# Patient Record
Sex: Male | Born: 1960 | Race: White | Hispanic: No | Marital: Married | Smoking: Never smoker
Health system: Southern US, Community
[De-identification: ages and names within clinical notes are randomized; demographics above are authoritative.]

## PROBLEM LIST (undated history)

## (undated) DIAGNOSIS — K219 Gastro-esophageal reflux disease without esophagitis: Secondary | ICD-10-CM

## (undated) DIAGNOSIS — I10 Essential (primary) hypertension: Secondary | ICD-10-CM

## (undated) DIAGNOSIS — M1990 Unspecified osteoarthritis, unspecified site: Secondary | ICD-10-CM

## (undated) DIAGNOSIS — J449 Chronic obstructive pulmonary disease, unspecified: Secondary | ICD-10-CM

## (undated) DIAGNOSIS — F988 Other specified behavioral and emotional disorders with onset usually occurring in childhood and adolescence: Secondary | ICD-10-CM

## (undated) DIAGNOSIS — M549 Dorsalgia, unspecified: Secondary | ICD-10-CM

## (undated) DIAGNOSIS — E119 Type 2 diabetes mellitus without complications: Secondary | ICD-10-CM

## (undated) DIAGNOSIS — F32A Depression, unspecified: Secondary | ICD-10-CM

## (undated) DIAGNOSIS — E785 Hyperlipidemia, unspecified: Secondary | ICD-10-CM

## (undated) HISTORY — PX: BACK SURGERY: SHX140

## (undated) HISTORY — PX: HERNIA REPAIR: SHX51

## (undated) HISTORY — PX: SPINE SURGERY: SHX786

## (undated) HISTORY — PX: FRACTURE SURGERY: SHX138

---

## 2013-12-10 ENCOUNTER — Observation Stay: Payer: Self-pay | Admitting: Surgery

## 2013-12-10 ENCOUNTER — Emergency Department: Payer: BC Managed Care – PPO

## 2013-12-10 ENCOUNTER — Observation Stay
Admission: EM | Admit: 2013-12-10 | Discharge: 2013-12-13 | Disposition: A | Discharge status: Home / Self Care | Payer: BC Managed Care – PPO | Attending: Surgery | Admitting: Surgery

## 2013-12-10 DIAGNOSIS — Z794 Long term (current) use of insulin: Secondary | ICD-10-CM | POA: Insufficient documentation

## 2013-12-10 DIAGNOSIS — S060X1A Concussion with loss of consciousness of 30 minutes or less, initial encounter: Secondary | ICD-10-CM | POA: Insufficient documentation

## 2013-12-10 DIAGNOSIS — S27321A Contusion of lung, unilateral, initial encounter: Principal | ICD-10-CM | POA: Insufficient documentation

## 2013-12-10 DIAGNOSIS — M25511 Pain in right shoulder: Secondary | ICD-10-CM | POA: Insufficient documentation

## 2013-12-10 DIAGNOSIS — F329 Major depressive disorder, single episode, unspecified: Secondary | ICD-10-CM | POA: Insufficient documentation

## 2013-12-10 DIAGNOSIS — E1165 Type 2 diabetes mellitus with hyperglycemia: Secondary | ICD-10-CM | POA: Insufficient documentation

## 2013-12-10 DIAGNOSIS — M160 Bilateral primary osteoarthritis of hip: Secondary | ICD-10-CM | POA: Insufficient documentation

## 2013-12-10 DIAGNOSIS — J449 Chronic obstructive pulmonary disease, unspecified: Secondary | ICD-10-CM | POA: Insufficient documentation

## 2013-12-10 DIAGNOSIS — J9611 Chronic respiratory failure with hypoxia: Secondary | ICD-10-CM | POA: Insufficient documentation

## 2013-12-10 DIAGNOSIS — Z9981 Dependence on supplemental oxygen: Secondary | ICD-10-CM | POA: Insufficient documentation

## 2013-12-10 DIAGNOSIS — M542 Cervicalgia: Secondary | ICD-10-CM | POA: Insufficient documentation

## 2013-12-10 DIAGNOSIS — K219 Gastro-esophageal reflux disease without esophagitis: Secondary | ICD-10-CM | POA: Insufficient documentation

## 2013-12-10 DIAGNOSIS — Y92410 Unspecified street and highway as the place of occurrence of the external cause: Secondary | ICD-10-CM | POA: Insufficient documentation

## 2013-12-10 DIAGNOSIS — Y999 Unspecified external cause status: Secondary | ICD-10-CM | POA: Insufficient documentation

## 2013-12-10 DIAGNOSIS — F988 Other specified behavioral and emotional disorders with onset usually occurring in childhood and adolescence: Secondary | ICD-10-CM | POA: Insufficient documentation

## 2013-12-10 DIAGNOSIS — E785 Hyperlipidemia, unspecified: Secondary | ICD-10-CM | POA: Insufficient documentation

## 2013-12-10 DIAGNOSIS — R55 Syncope and collapse: Secondary | ICD-10-CM

## 2013-12-10 DIAGNOSIS — S060X9A Concussion with loss of consciousness of unspecified duration, initial encounter: Secondary | ICD-10-CM

## 2013-12-10 DIAGNOSIS — M546 Pain in thoracic spine: Secondary | ICD-10-CM | POA: Insufficient documentation

## 2013-12-10 DIAGNOSIS — G894 Chronic pain syndrome: Secondary | ICD-10-CM | POA: Insufficient documentation

## 2013-12-10 DIAGNOSIS — E119 Type 2 diabetes mellitus without complications: Secondary | ICD-10-CM

## 2013-12-10 DIAGNOSIS — M4322 Fusion of spine, cervical region: Secondary | ICD-10-CM | POA: Insufficient documentation

## 2013-12-10 DIAGNOSIS — Z79899 Other long term (current) drug therapy: Secondary | ICD-10-CM | POA: Insufficient documentation

## 2013-12-10 DIAGNOSIS — I10 Essential (primary) hypertension: Secondary | ICD-10-CM | POA: Insufficient documentation

## 2013-12-10 HISTORY — DX: Type 2 diabetes mellitus without complications: E11.9

## 2013-12-10 HISTORY — DX: Essential (primary) hypertension: I10

## 2013-12-10 HISTORY — DX: Other specified behavioral and emotional disorders with onset usually occurring in childhood and adolescence: F98.8

## 2013-12-10 HISTORY — DX: Depression, unspecified: F32.A

## 2013-12-10 HISTORY — DX: Chronic obstructive pulmonary disease, unspecified: J44.9

## 2013-12-10 HISTORY — DX: Gastro-esophageal reflux disease without esophagitis: K21.9

## 2013-12-10 HISTORY — DX: Dorsalgia, unspecified: M54.9

## 2013-12-10 HISTORY — DX: Hyperlipidemia, unspecified: E78.5

## 2013-12-10 HISTORY — DX: Unspecified osteoarthritis, unspecified site: M19.90

## 2013-12-10 LAB — I-STAT CHEM 8 CARTRIDGE
Anion Gap I-Stat: 19 — ABNORMAL HIGH (ref 7–16)
BUN I-Stat: 28 mg/dL — ABNORMAL HIGH (ref 6–20)
Calcium Ionized I-Stat: 4.7 mg/dL (ref 4.35–5.10)
Chloride I-Stat: 98 mMol/L (ref 98–112)
Creatinine I-Stat: 1.1 mg/dL (ref 0.90–1.30)
EGFR: >60 mL/min/{1.73_m2}
Glucose I-Stat: 372 mg/dL — ABNORMAL HIGH (ref 70–99)
Hematocrit I-Stat: 40 % (ref 39.0–52.5)
Hemoglobin I-Stat: 13.6 gm/dL (ref 13.0–17.5)
Potassium I-Stat: 4.6 mMol/L (ref 3.5–5.3)
Sodium I-Stat: 134 mMol/L — ABNORMAL LOW (ref 135–145)
TCO2 I-Stat: 23 mMol/L — ABNORMAL LOW (ref 24–29)

## 2013-12-10 LAB — CBC AND DIFFERENTIAL
Basophils %: 0.7 % (ref 0.0–3.0)
Basophils Absolute: 0 10*3/uL (ref 0.0–0.3)
Eosinophils %: 0.6 % (ref 0.0–7.0)
Eosinophils Absolute: 0 10*3/uL (ref 0.0–0.8)
Hematocrit: 39.5 % (ref 39.0–52.5)
Hemoglobin: 14 gm/dL (ref 13.0–17.5)
Lymphocytes Absolute: 1.4 10*3/uL (ref 0.6–5.1)
Lymphocytes: 26.8 % (ref 15.0–46.0)
MCH: 32 pg (ref 28–35)
MCHC: 35 gm/dL (ref 32–36)
MCV: 90 fL (ref 80–100)
MPV: 8.8 fL (ref 6.0–10.0)
Monocytes Absolute: 0.4 10*3/uL (ref 0.1–1.7)
Monocytes: 7.5 % (ref 3.0–15.0)
Neutrophils %: 64.4 % (ref 42.0–78.0)
Neutrophils Absolute: 3.3 10*3/uL (ref 1.7–8.6)
PLT CT: 142 10*3/uL (ref 130–440)
RBC: 4.39 10*6/uL (ref 4.00–5.70)
RDW: 11.4 % (ref 11.0–14.0)
WBC: 5.1 10*3/uL (ref 4.0–11.0)

## 2013-12-10 LAB — VH URINE DRUG SCREEN
Amphetamine: NEGATIVE
Barbiturates: NEGATIVE
Buprenorphine, Urine: NEGATIVE
Cannabinoids: NEGATIVE
Cocaine: NEGATIVE
Opiates: NEGATIVE
Phencyclidine: NEGATIVE
Urine Benzodiazepines: NEGATIVE
Urine Creatinine Random: 32.4 mg/dL
Urine Methadone Screen: NEGATIVE
Urine Oxycodone: NEGATIVE
Urine Specific Gravity: 1.002 (ref 1.001–1.040)
pH, Urine: 5.4 pH (ref 5.0–8.0)

## 2013-12-10 LAB — ECG 12-LEAD
P Wave Axis: 13 deg
P Wave Duration: 110 ms
P-R Interval: 158 ms
Patient Age: 53 years
Q-T Dispersion: 52 ms
Q-T Interval(Corrected): 421 ms
Q-T Interval: 344 ms
QRS Axis: 8 deg
QRS Duration: 88 ms
T Axis: -3 deg
Ventricular Rate: 90 /min

## 2013-12-10 LAB — VH I-STAT CHEM 8 NOTIFICATION

## 2013-12-10 LAB — VH DEXTROSE STICK GLUCOSE
Glucose POCT: 263 mg/dL — ABNORMAL HIGH (ref 70–99)
Glucose POCT: 220 mg/dL — ABNORMAL HIGH (ref 70–99)

## 2013-12-10 LAB — ETHANOL: Alcohol: <10 mg/dL (ref 0–9)

## 2013-12-10 LAB — I-STAT TROPONIN: Troponin I I-Stat: <0.02 ng/mL (ref 0.00–0.02)

## 2013-12-10 LAB — VH I-STAT TROPONIN NOTIFICATION

## 2013-12-10 MED ORDER — SODIUM CHLORIDE 0.9 % IJ SOLN
0.4000 mg | INTRAMUSCULAR | Status: DC | PRN
Start: 2013-12-10 — End: 2013-12-13

## 2013-12-10 MED ORDER — ENOXAPARIN SODIUM 30 MG/0.3ML SC SOLN
30.0000 mg | Freq: Two times a day (BID) | SUBCUTANEOUS | Status: DC
Start: 2013-12-10 — End: 2013-12-13
  Administered 2013-12-10 – 2013-12-13 (×6): 30 mg via SUBCUTANEOUS
  Filled 2013-12-10 (×8): qty 0.3

## 2013-12-10 MED ORDER — SODIUM CHLORIDE 0.9 % IV SOLN
INTRAVENOUS | Status: AC
Start: 2013-12-10 — End: 2013-12-10

## 2013-12-10 MED ORDER — INSULIN ASPART 100 UNIT/ML SC SOPN
2.0000 [IU] | PEN_INJECTOR | Freq: Three times a day (TID) | SUBCUTANEOUS | Status: DC | PRN
Start: 2013-12-10 — End: 2013-12-13
  Administered 2013-12-11: 7 [IU] via SUBCUTANEOUS
  Administered 2013-12-11: 4 [IU] via SUBCUTANEOUS
  Administered 2013-12-11: 7 [IU] via SUBCUTANEOUS
  Administered 2013-12-12: 4 [IU] via SUBCUTANEOUS
  Administered 2013-12-13: 7 [IU] via SUBCUTANEOUS
  Administered 2013-12-13: 2 [IU] via SUBCUTANEOUS

## 2013-12-10 MED ORDER — IOHEXOL 350 MG/ML IV SOLN
100.0000 mL | Freq: Once | INTRAVENOUS | Status: AC | PRN
Start: 2013-12-10 — End: 2013-12-10
  Administered 2013-12-10: 100 mL via INTRAVENOUS

## 2013-12-10 MED ORDER — BACLOFEN 10 MG PO TABS
10.0000 mg | ORAL_TABLET | Freq: Two times a day (BID) | ORAL | Status: DC
Start: 2013-12-10 — End: 2013-12-13
  Administered 2013-12-10 – 2013-12-13 (×7): 10 mg via ORAL
  Filled 2013-12-10 (×8): qty 1

## 2013-12-10 MED ORDER — BISACODYL 10 MG RE SUPP
10.0000 mg | Freq: Every day | RECTAL | Status: DC | PRN
Start: 2013-12-10 — End: 2013-12-13

## 2013-12-10 MED ORDER — ONDANSETRON HCL 4 MG/2ML IJ SOLN
4.0000 mg | Freq: Once | INTRAMUSCULAR | Status: AC
Start: 2013-12-10 — End: 2013-12-10
  Administered 2013-12-10: 4 mg via INTRAVENOUS

## 2013-12-10 MED ORDER — LISINOPRIL 20 MG PO TABS
20.0000 mg | ORAL_TABLET | Freq: Every day | ORAL | Status: DC
Start: 2013-12-10 — End: 2013-12-13
  Administered 2013-12-10 – 2013-12-13 (×4): 20 mg via ORAL
  Filled 2013-12-10 (×4): qty 1

## 2013-12-10 MED ORDER — BUPROPION HCL ER (SR) 150 MG PO TB12
150.0000 mg | ORAL_TABLET | Freq: Two times a day (BID) | ORAL | Status: DC
Start: 2013-12-10 — End: 2013-12-13
  Administered 2013-12-10 – 2013-12-13 (×7): 150 mg via ORAL
  Filled 2013-12-10 (×8): qty 1

## 2013-12-10 MED ORDER — OXYCODONE-ACETAMINOPHEN 5-325 MG PO TABS
1.0000 | ORAL_TABLET | ORAL | Status: DC | PRN
Start: 2013-12-10 — End: 2013-12-11
  Administered 2013-12-10 – 2013-12-11 (×5): 1 via ORAL
  Filled 2013-12-10 (×4): qty 1

## 2013-12-10 MED ORDER — METHYLPHENIDATE HCL 10 MG PO TABS
20.0000 mg | ORAL_TABLET | Freq: Three times a day (TID) | ORAL | Status: DC
Start: 2013-12-10 — End: 2013-12-13
  Administered 2013-12-10 – 2013-12-13 (×10): 20 mg via ORAL
  Filled 2013-12-10 (×10): qty 2

## 2013-12-10 MED ORDER — GLUCAGON 1 MG IJ SOLR (WRAP)
1.0000 mg | INTRAMUSCULAR | Status: DC | PRN
Start: 2013-12-10 — End: 2013-12-13

## 2013-12-10 MED ORDER — ONDANSETRON HCL 4 MG/2ML IJ SOLN
4.0000 mg | Freq: Four times a day (QID) | INTRAMUSCULAR | Status: DC | PRN
Start: 2013-12-10 — End: 2013-12-13

## 2013-12-10 MED ORDER — VANCOMYCIN HCL IN DEXTROSE 1-5 GM/200ML-% IV SOLN
1000.0000 mg | Freq: Once | INTRAVENOUS | Status: DC
Start: 2013-12-10 — End: 2013-12-10

## 2013-12-10 MED ORDER — INSULIN ASPART 100 UNIT/ML SC SOPN
12.0000 [IU] | PEN_INJECTOR | Freq: Three times a day (TID) | SUBCUTANEOUS | Status: DC
Start: 2013-12-10 — End: 2013-12-13
  Administered 2013-12-10 – 2013-12-13 (×9): 12 [IU] via SUBCUTANEOUS
  Filled 2013-12-10: qty 3

## 2013-12-10 MED ORDER — SODIUM CHLORIDE 0.9 % IV BOLUS
3000.0000 mL | Freq: Once | INTRAVENOUS | Status: DC
Start: 2013-12-10 — End: 2013-12-10

## 2013-12-10 MED ORDER — VH HYDROMORPHONE HCL PF 1 MG/ML CARPUJECT
1.0000 mg | Freq: Once | INTRAMUSCULAR | Status: AC
Start: 2013-12-10 — End: 2013-12-10
  Administered 2013-12-10: 1 mg via INTRAVENOUS

## 2013-12-10 MED ORDER — DOCUSATE SODIUM 100 MG PO CAPS
100.0000 mg | ORAL_CAPSULE | Freq: Two times a day (BID) | ORAL | Status: DC
Start: 2013-12-10 — End: 2013-12-13
  Administered 2013-12-10 – 2013-12-13 (×6): 100 mg via ORAL
  Filled 2013-12-10 (×7): qty 1

## 2013-12-10 MED ORDER — FAMOTIDINE 20 MG PO TABS
20.0000 mg | ORAL_TABLET | Freq: Two times a day (BID) | ORAL | Status: DC
Start: 2013-12-10 — End: 2013-12-13
  Administered 2013-12-10 – 2013-12-13 (×7): 20 mg via ORAL
  Filled 2013-12-10 (×9): qty 1

## 2013-12-10 MED ORDER — ACETAMINOPHEN 325 MG PO TABS
650.0000 mg | ORAL_TABLET | ORAL | Status: DC | PRN
Start: 2013-12-10 — End: 2013-12-13

## 2013-12-10 MED ORDER — OXYCODONE-ACETAMINOPHEN 5-325 MG PO TABS
ORAL_TABLET | ORAL | Status: AC
Start: 2013-12-10 — End: ?
  Filled 2013-12-10: qty 1

## 2013-12-10 MED ORDER — VH HYDROMORPHONE HCL 1 MG/ML (NARRATOR)
INTRAMUSCULAR | Status: AC
Start: 2013-12-10 — End: ?
  Filled 2013-12-10: qty 1

## 2013-12-10 MED ORDER — ESCITALOPRAM OXALATE 10 MG PO TABS
30.0000 mg | ORAL_TABLET | Freq: Every day | ORAL | Status: DC
Start: 2013-12-10 — End: 2013-12-13
  Administered 2013-12-10 – 2013-12-13 (×4): 30 mg via ORAL
  Filled 2013-12-10 (×4): qty 3

## 2013-12-10 MED ORDER — CLONIDINE HCL 0.2 MG PO TABS
0.3000 mg | ORAL_TABLET | Freq: Every evening | ORAL | Status: DC
Start: 2013-12-10 — End: 2013-12-13
  Administered 2013-12-10 – 2013-12-13 (×4): 0.3 mg via ORAL
  Filled 2013-12-10 (×4): qty 1

## 2013-12-10 MED ORDER — CELECOXIB 100 MG PO CAPS
100.0000 mg | ORAL_CAPSULE | Freq: Two times a day (BID) | ORAL | Status: DC
Start: 2013-12-10 — End: 2013-12-13
  Administered 2013-12-10 – 2013-12-13 (×7): 100 mg via ORAL
  Filled 2013-12-10 (×8): qty 1

## 2013-12-10 MED ORDER — SODIUM CHLORIDE 0.9 % IV MBP
500.0000 mg | Freq: Four times a day (QID) | INTRAVENOUS | Status: DC
Start: 2013-12-10 — End: 2013-12-10

## 2013-12-10 MED ORDER — ONDANSETRON HCL 4 MG/2ML IJ SOLN
INTRAMUSCULAR | Status: AC
Start: 2013-12-10 — End: ?
  Filled 2013-12-10: qty 2

## 2013-12-10 MED ORDER — SODIUM CHLORIDE 0.9 % IV BOLUS
500.0000 mL | Freq: Once | INTRAVENOUS | Status: AC
Start: 2013-12-10 — End: 2013-12-10
  Administered 2013-12-10: 500 mL via INTRAVENOUS

## 2013-12-10 MED ORDER — DEXTROSE 50 % IV SOLN
25.0000 mL | INTRAVENOUS | Status: DC | PRN
Start: 2013-12-10 — End: 2013-12-13

## 2013-12-10 MED ORDER — PRAVASTATIN SODIUM 40 MG PO TABS
80.0000 mg | ORAL_TABLET | Freq: Every evening | ORAL | Status: DC
Start: 2013-12-10 — End: 2013-12-13
  Administered 2013-12-10 – 2013-12-12 (×3): 80 mg via ORAL
  Filled 2013-12-10 (×4): qty 2

## 2013-12-10 MED ORDER — INSULIN GLARGINE 100 UNIT/ML SC SOPN
20.0000 [IU] | PEN_INJECTOR | Freq: Every evening | SUBCUTANEOUS | Status: DC
Start: 2013-12-10 — End: 2013-12-13
  Administered 2013-12-10 – 2013-12-12 (×3): 20 [IU] via SUBCUTANEOUS
  Filled 2013-12-10: qty 3

## 2013-12-10 NOTE — ED Notes (Signed)
[facility] for med list

## 2013-12-10 NOTE — ED Notes (Signed)
Xray to room

## 2013-12-10 NOTE — Plan of Care (Signed)
Problem: Pain interferes with ability to perform ADL  Goal: Pain at adequate level as identified by patient  Outcome: Progressing
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
## 2013-12-10 NOTE — Plan of Care (Signed)
Problem: Safety  Goal: Patient will be free from injury during hospitalization  Outcome: Progressing    Problem: Pain  Goal: Patient's pain/discomfort is manageable  Outcome: Progressing
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
## 2013-12-10 NOTE — Progress Notes (Signed)
A/Ox3, rating pain 7/10.  Pt denies any other complaints and would like pain med after he eats.  Neuro check WNL.  VSS, assessment complete, call bell w/in reach, will continue monitoring.

## 2013-12-10 NOTE — ED Notes (Signed)
Patient resting in bed. C/o of soreness in neck and shoulder, rates pain a 6. Medicated for pain with 1 percocet.   Transport ordered. Call with any questions [phone]
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
## 2013-12-11 ENCOUNTER — Observation Stay: Payer: BC Managed Care – PPO

## 2013-12-11 DIAGNOSIS — J9611 Chronic respiratory failure with hypoxia: Secondary | ICD-10-CM | POA: Diagnosis present

## 2013-12-11 LAB — CBC AND DIFFERENTIAL
Basophils %: 0.9 % (ref 0.0–3.0)
Basophils Absolute: 0 10*3/uL (ref 0.0–0.3)
Eosinophils %: 1.5 % (ref 0.0–7.0)
Eosinophils Absolute: 0.1 10*3/uL (ref 0.0–0.8)
Hematocrit: 37.3 % — ABNORMAL LOW (ref 39.0–52.5)
Hemoglobin: 12.9 gm/dL — ABNORMAL LOW (ref 13.0–17.5)
Lymphocytes Absolute: 2 10*3/uL (ref 0.6–5.1)
Lymphocytes: 42.3 % (ref 15.0–46.0)
MCH: 32 pg (ref 28–35)
MCHC: 35 gm/dL (ref 32–36)
MCV: 92 fL (ref 80–100)
MPV: 9.2 fL (ref 6.0–10.0)
Monocytes Absolute: 0.3 10*3/uL (ref 0.1–1.7)
Monocytes: 7 % (ref 3.0–15.0)
Neutrophils %: 48.2 % (ref 42.0–78.0)
Neutrophils Absolute: 2.3 10*3/uL (ref 1.7–8.6)
PLT CT: 128 10*3/uL — ABNORMAL LOW (ref 130–440)
RBC: 4.07 10*6/uL (ref 4.00–5.70)
RDW: 11.7 % (ref 11.0–14.0)
WBC: 4.7 10*3/uL (ref 4.0–11.0)

## 2013-12-11 LAB — COMPREHENSIVE METABOLIC PANEL
ALT: 41 U/L (ref 0–55)
AST (SGOT): 25 U/L (ref 10–42)
Albumin/Globulin Ratio: 1.09 Ratio (ref 0.70–1.50)
Albumin: 3.5 gm/dL (ref 3.5–5.0)
Alkaline Phosphatase: 66 U/L (ref 40–145)
Anion Gap: 13.8 mMol/L (ref 7.0–18.0)
BUN / Creatinine Ratio: 22.9 Ratio (ref 10.0–30.0)
BUN: 25 mg/dL — ABNORMAL HIGH (ref 7–22)
Bilirubin, Total: 0.4 mg/dL (ref 0.1–1.2)
CO2: 23.4 mMol/L (ref 20.0–30.0)
Calcium: 9 mg/dL (ref 8.5–10.5)
Chloride: 103 mMol/L (ref 98–110)
Creatinine: 1.09 mg/dL (ref 0.80–1.30)
EGFR: >60 mL/min/{1.73_m2}
Globulin: 3.2 gm/dL (ref 2.0–4.0)
Glucose: 226 mg/dL — ABNORMAL HIGH (ref 70–99)
Osmolality Calc: 283 mOsm/kg (ref 275–300)
Potassium: 4.2 mMol/L (ref 3.5–5.3)
Protein, Total: 6.7 gm/dL (ref 6.0–8.3)
Sodium: 136 mMol/L (ref 136–147)

## 2013-12-11 LAB — VH DEXTROSE STICK GLUCOSE
Glucose POCT: 254 mg/dL — ABNORMAL HIGH (ref 70–99)
Glucose POCT: 235 mg/dL — ABNORMAL HIGH (ref 70–99)
Glucose POCT: 257 mg/dL — ABNORMAL HIGH (ref 70–99)
Glucose POCT: 168 mg/dL — ABNORMAL HIGH (ref 70–99)

## 2013-12-11 LAB — PHOSPHORUS: Phosphorus: 4 mg/dL (ref 2.3–4.7)

## 2013-12-11 LAB — TROPONIN I
Troponin I: <0.01 ng/mL (ref 0.00–0.02)
Troponin I: 0.01 ng/mL (ref 0.00–0.02)

## 2013-12-11 LAB — MAGNESIUM: Magnesium: 2 mg/dL (ref 1.6–2.6)

## 2013-12-11 MED ORDER — OXYCODONE-ACETAMINOPHEN 5-325 MG PO TABS
1.0000 | ORAL_TABLET | ORAL | Status: DC | PRN
Start: 2013-12-11 — End: 2013-12-13
  Administered 2013-12-11 – 2013-12-13 (×5): 2 via ORAL
  Administered 2013-12-13: 1 via ORAL
  Filled 2013-12-11 (×3): qty 2
  Filled 2013-12-11: qty 1
  Filled 2013-12-11 (×2): qty 2

## 2013-12-11 MED ORDER — DIPHENHYDRAMINE HCL 25 MG PO CAPS
25.0000 mg | ORAL_CAPSULE | Freq: Four times a day (QID) | ORAL | Status: DC | PRN
Start: 2013-12-11 — End: 2013-12-13
  Administered 2013-12-11: 25 mg via ORAL
  Filled 2013-12-11: qty 1

## 2013-12-11 NOTE — Progress Notes (Signed)
Pt alert and oreinted 4x. Reports pain is 5-6/10. Pain medication given per order. Will continue to monitor.

## 2013-12-11 NOTE — Progress Notes (Signed)
A/Ox3, rating pain 6/10.   Pt denies any other complaints.  VSS, assessment complete, call bell w/in reach, will continue monitoring.
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
## 2013-12-11 NOTE — Plan of Care (Signed)
Problem: Safety  Goal: Patient will be free from injury during hospitalization  Outcome: Progressing    Problem: Pain  Goal: Patient's pain/discomfort is manageable  Outcome: Progressing    Problem: Pain interferes with ability to perform ADL  Goal: Pain at adequate level as identified by patient  Outcome: Progressing
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
## 2013-12-11 NOTE — Plan of Care (Signed)
Problem: Safety  Goal: Patient will be free from injury during hospitalization  Outcome: Progressing
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
## 2013-12-12 ENCOUNTER — Observation Stay: Payer: BC Managed Care – PPO

## 2013-12-12 LAB — VH DEXTROSE STICK GLUCOSE
Glucose POCT: 133 mg/dL — ABNORMAL HIGH (ref 70–99)
Glucose POCT: 224 mg/dL — ABNORMAL HIGH (ref 70–99)
Glucose POCT: 80 mg/dL (ref 70–99)
Glucose POCT: 225 mg/dL — ABNORMAL HIGH (ref 70–99)

## 2013-12-12 LAB — URINALYSIS
Bilirubin, UA: NEGATIVE
Blood, UA: NEGATIVE
Glucose, UA: NEGATIVE mg/dL
Ketones UA: NEGATIVE mg/dL
Leukocyte Esterase, UA: NEGATIVE Leu/uL
Nitrite, UA: NEGATIVE
Protein, UR: NEGATIVE mg/dL
RBC, UA: 1 /hpf (ref 0–4)
Urine Specific Gravity: 1.005 (ref 1.001–1.040)
Urobilinogen, UA: NORMAL mg/dL
WBC, UA: <1 /hpf (ref 0–4)
pH, Urine: 5 pH (ref 5.0–8.0)

## 2013-12-12 LAB — BASIC METABOLIC PANEL
Anion Gap: 11.9 mMol/L (ref 7.0–18.0)
BUN / Creatinine Ratio: 18.6 Ratio (ref 10.0–30.0)
BUN: 19 mg/dL (ref 7–22)
CO2: 24.2 mMol/L (ref 20.0–30.0)
Calcium: 9.4 mg/dL (ref 8.5–10.5)
Chloride: 106 mMol/L (ref 98–110)
Creatinine: 1.02 mg/dL (ref 0.80–1.30)
EGFR: >60 mL/min/{1.73_m2}
Glucose: 141 mg/dL — ABNORMAL HIGH (ref 70–99)
Osmolality Calc: 280 mOsm/kg (ref 275–300)
Potassium: 4.1 mMol/L (ref 3.5–5.3)
Sodium: 138 mMol/L (ref 136–147)

## 2013-12-12 MED ORDER — BACITRACIN 50000 UNITS IM SOLR
INTRAMUSCULAR | Status: AC
Start: 2013-12-12 — End: ?
  Filled 2013-12-12: qty 100000

## 2013-12-12 MED ORDER — VH PERFLUTREN LIPID MICROSPHERE 6.52 MG/ML IV SUSP
INTRAVENOUS | Status: AC
Start: 2013-12-12 — End: ?
  Filled 2013-12-12: qty 2

## 2013-12-12 MED ORDER — VH PERFLUTREN LIPID MICROSPHERE 6.52 MG/ML IV SUSP
Freq: Once | INTRAVENOUS | Status: AC
Start: 2013-12-12 — End: 2013-12-12
  Administered 2013-12-12: 2 mL via INTRAVENOUS
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
## 2013-12-12 NOTE — Progress Notes (Signed)
RN shift assessment completed.  Pt AOx4, c/o pain 7/10 but is unable to receive pain meds at this time.  Denies any n/v, tolerating consistent carb diet with blood glucose level of 133 this AM; BS active with reported flatus. Call bell within reach.  Will continue to monitor.
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
## 2013-12-13 DIAGNOSIS — S060X1A Concussion with loss of consciousness of 30 minutes or less, initial encounter: Secondary | ICD-10-CM

## 2013-12-13 LAB — VH DEXTROSE STICK GLUCOSE
Glucose POCT: 182 mg/dL — ABNORMAL HIGH (ref 70–99)
Glucose POCT: 139 mg/dL — ABNORMAL HIGH (ref 70–99)
Glucose POCT: 256 mg/dL — ABNORMAL HIGH (ref 70–99)

## 2013-12-13 MED ORDER — OXYCODONE-ACETAMINOPHEN 5-325 MG PO TABS
1.0000 | ORAL_TABLET | ORAL | Status: DC | PRN
Start: 2013-12-13 — End: 2017-04-30
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
## 2013-12-13 NOTE — Plan of Care (Signed)
Problem: Pain  Goal: Patient's pain/discomfort is manageable  Outcome: Adequate for Discharge    Comments:   Good pain control on PO analgesic.
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
## 2013-12-13 NOTE — Progress Notes (Signed)
Discharged to home. Instructions given.

## 2014-07-14 DEATH — deceased

## 2019-04-12 IMAGING — MR MULTI PARAMETRIC MRI PROSTATE WITHOUT AND WITH CONTRAST
13 series · 48 of 48 positions shown · IV contrast (agent unspecified)
Comparison: None at this facility.

MULTI PARAMETRIC MRI PROSTATE WITHOUT AND WITH CONTRAST, 04/12/2019 [DATE]: 
CLINICAL INDICATION: History of prostatic malignancy diagnosed 5556, untreated. 
Recent PSA is 7.7.
TECHNIQUE: Multiple parametric sequences were performed. 
Pre-contrast: T1 axial of the entire pelvis. T2 sagittal, axial and coronal, T1 
axial, acquired of the prostate. Diffusion with multiple B values of 1999, 5200 
calculated ADC value for mapping. 
Post contrast: Rapid sequence dynamic and axial planes through the prostate and 
seminal vesicles, T1 axial with fat sat of the entire pelvis. 3-D renderings 
were reconstructed on an independent workstation. The images were also evaluated 
with Dyna CAD computer aided detection.  12.5 cc Gadovist injected intravenously 
at 3 cc/s.  As per [HOSPITAL] guidelines 3-D reconstructions 
are performed with concurrent physician supervision. The patient's eGFR was 
calculated to be 61.8 using the i-STAT device.

[Series 101: survey · axial · 10.0mm · 1.39mm/px · 1 of 11 slices shown]
[im 1/11]
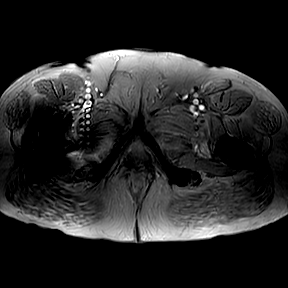

[Series 201: T1 · axial · 5.0mm · 0.75mm/px · 1 of 44 slices shown (1 of 2)]
[im 1/44]
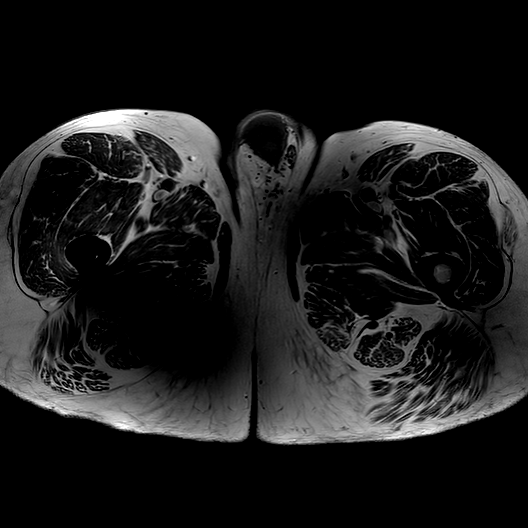

[Series 301: t2w sag · sagittal · 3.0mm · 0.45mm/px · 1 of 30 slices shown]
[im 1/30]
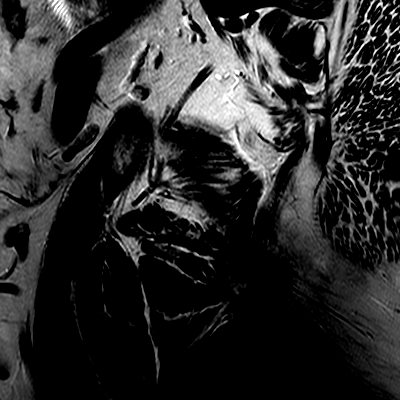

[Series 401: t2w cor · coronal · 3.0mm · 0.42mm/px · 1 of 30 slices shown]
[im 1/30]
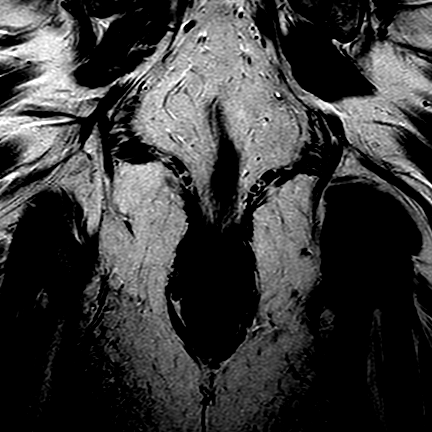

[Series 501: t2w ax · axial · 3.0mm · 0.27mm/px · 1 of 32 slices shown]
[im 1/32]
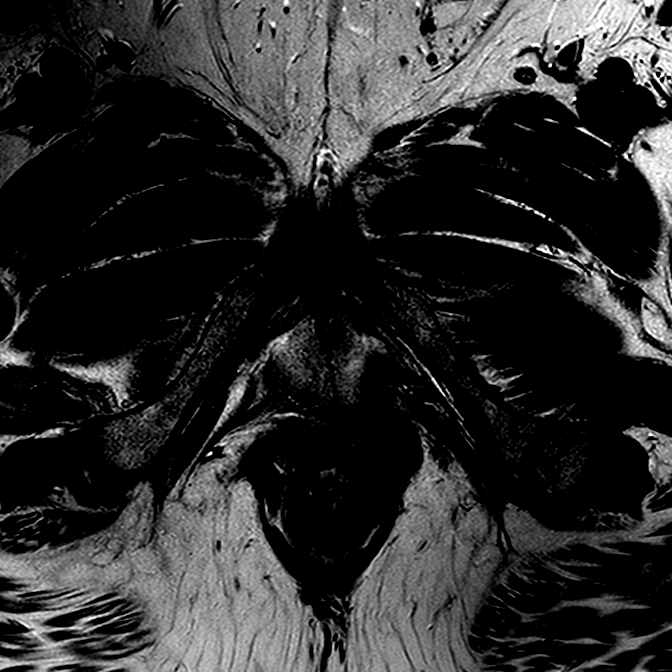

[Series 601: new-dwi_3b* 3mm* · axial · 3.0mm · 1.28mm/px · 1 of 64 slices shown]
[im 1/64]
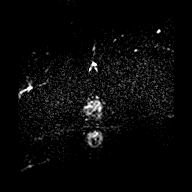

[Series 602: ADC · axial · 3.0mm · 1.28mm/px · 1 of 32 slices shown (1 of 2)]
[im 1/32]
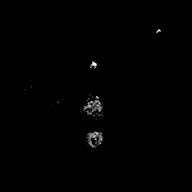

[Series 603: ADC · axial · 3.0mm · 1.28mm/px · 1 of 32 slices shown (2 of 2)]
[im 1/32]
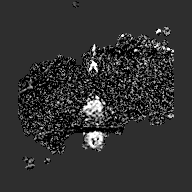

[Series 604: (id) · axial · 3.0mm · 1.28mm/px · 1 of 32 slices shown (1 of 3)]
[im 1/32]
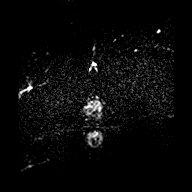

[Series 605: (id) · axial · 3.0mm · 1.28mm/px · 1 of 32 slices shown (2 of 3)]
[im 1/32]
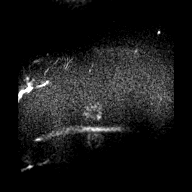

[Series 701: (id) · axial · 3.0mm · 1.28mm/px · 1 of 32 slices shown (3 of 3)]
[im 1/32]
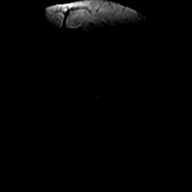

[Series 901: dyn 3mm*(ap) · axial · 3.0mm · 1.38mm/px · z∈[-13,+80]mm · 36 of 1440 slices shown]
[im 1/1440]
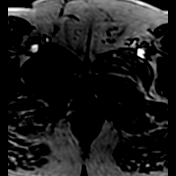
[im 42/1440]
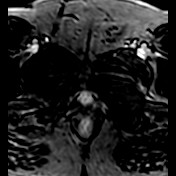
[im 83/1440]
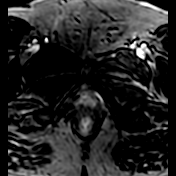
[im 124/1440]
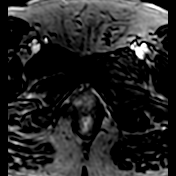
[im 165/1440]
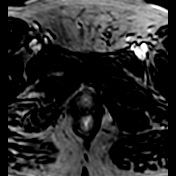
[im 206/1440]
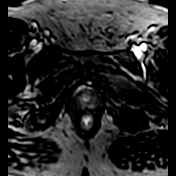
[im 247/1440]
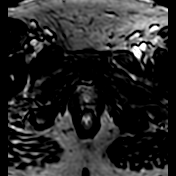
[im 288/1440]
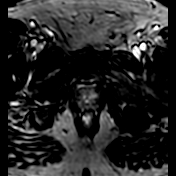
[im 329/1440]
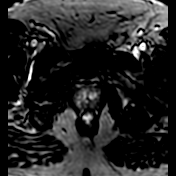
[im 371/1440]
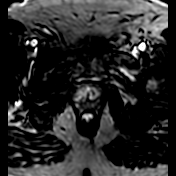
[im 412/1440]
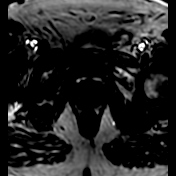
[im 453/1440]
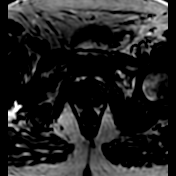
[im 494/1440]
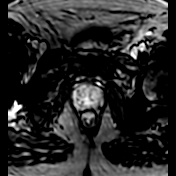
[im 535/1440]
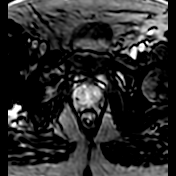
[im 576/1440]
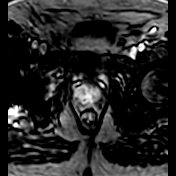
[im 617/1440]
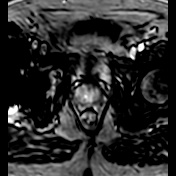
[im 658/1440]
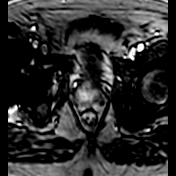
[im 699/1440]
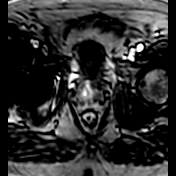
[im 741/1440]
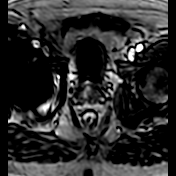
[im 782/1440]
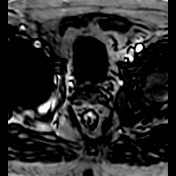
[im 823/1440]
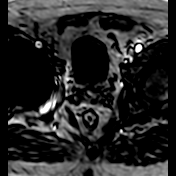
[im 864/1440]
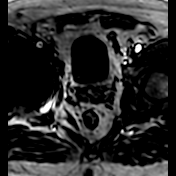
[im 905/1440]
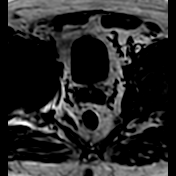
[im 946/1440]
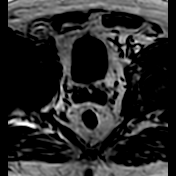
[im 987/1440]
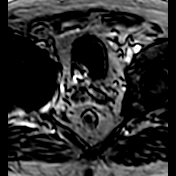
[im 1028/1440]
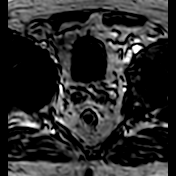
[im 1069/1440]
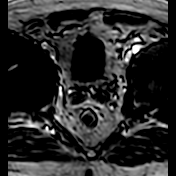
[im 1111/1440]
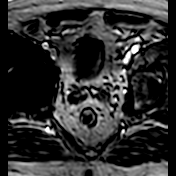
[im 1152/1440]
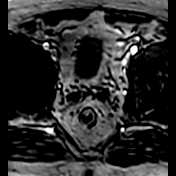
[im 1193/1440]
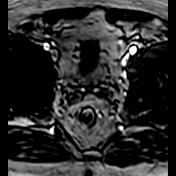
[im 1234/1440]
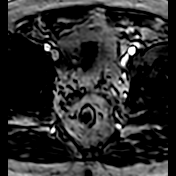
[im 1275/1440]
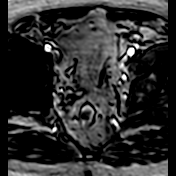
[im 1316/1440]
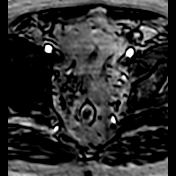
[im 1357/1440]
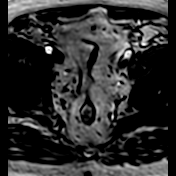
[im 1398/1440]
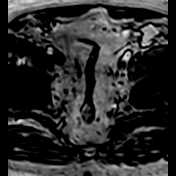
[im 1440/1440]
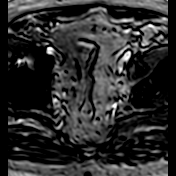

[Series 1001: T1 · axial · 5.0mm · 0.75mm/px · 1 of 44 slices shown (2 of 2)]
[im 1/44]
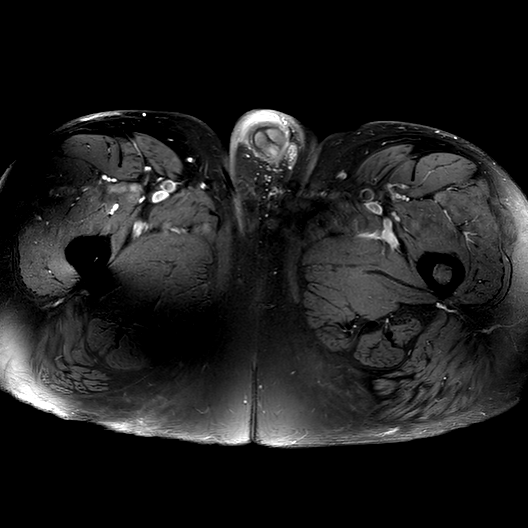

[48 of 48 positions shown; findings below may reference images not displayed]

FINDINGS: VOLUME: Dimensions are 4.3 x 3.8 x 4.3 cm with a volume of 34 cc's. 
CENTRAL GLAND: There is minimal Central gland hypertrophy. No suspicious central 
gland lesions are found. 
PERIPHERAL ZONE: There are no suspicious peripheral zone lesions on T2 images or 
on diffusion sequences. 
PROSTATE CAPSULE: Normal. 
MUSCLE SIDE WALLS: Normal. 
SEMINAL VESICLES: Normal. 
BLADDER: Unremarkable. 
LYMPHADENOPATHY: No pathologically enlarged pelvic lymph nodes are detected. 
BONES: No suspicious skeletal lesions are found in the pelvic girdle. 
OTHER: Left hamstring tendinopathy is noted. There is artifact in the RIGHT hip 
due to orthopedic hardware. Severe degenerative changes are noted in the lower 
lumbar spine. Surgical changes from inguinal herniorrhaphy is noted.
IMPRESSION: (PI-RADS 2): Low (clinically significant cancer is unlikely to be present).

## 2019-09-07 IMAGING — MR MULTI PARAMETRIC MRI PROSTATE WITHOUT AND WITH CONTRAST
13 series · 48 of 48 positions shown · IV contrast (gadavist)
Comparison: Previous prostate MRI dated 04/12/2019

MULTI PARAMETRIC MRI PROSTATE WITHOUT AND WITH CONTRAST, 09/07/2019 [DATE]: 
CLINICAL INDICATION: History of low-grade prostate neoplasm patient undergoing 
surveillance with increasing PSA level. 
PSA level: 11.6 ng/mL
TECHNIQUE: Multiple parametric sequences were performed. 
Pre-contrast: T1 axial of the entire pelvis. T2 sagittal, axial and coronal, T1 
axial, acquired of the prostate. Diffusion with multiple B values of 4111, 1688 
calculated ADC value for mapping. 
Post contrast: Rapid sequence dynamic and axial planes through the prostate and 
seminal vesicles, T1 axial with fat sat of the entire pelvis. 3-D renderings 
were reconstructed on an independent workstation. The images were also evaluated 
with Dyna CAD computer aided detection.  13 mL of Gadavist was administered 
intravenously.  As per [HOSPITAL] guidelines 3-D 
reconstructions are performed with concurrent physician supervision.

[Series 201: survey · axial · 10.0mm · 1.39mm/px · 1 of 11 slices shown]
[im 1/11]
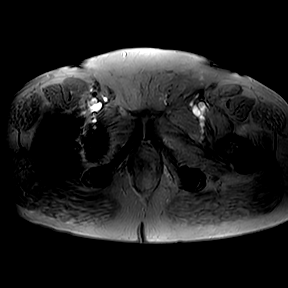

[Series 301: T1 · axial · 6.0mm · 0.57mm/px · 1 of 36 slices shown]
[im 1/36]
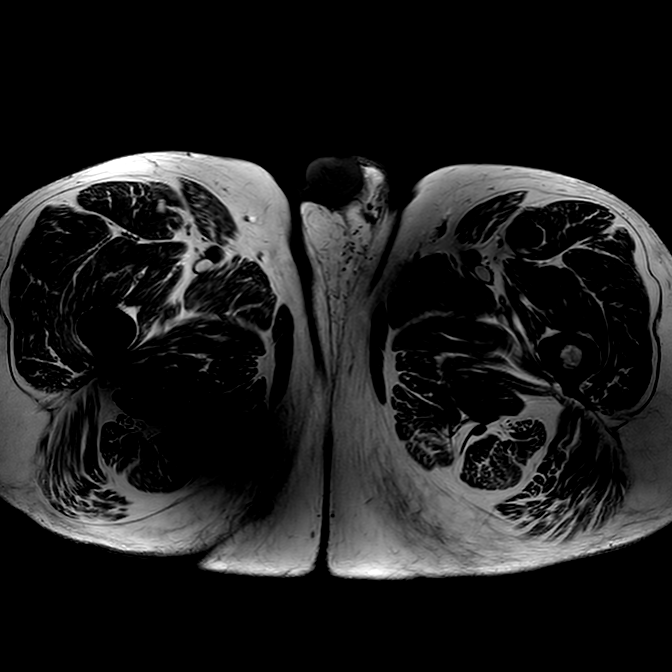

[Series 401: t2w sag · sagittal · 3.0mm · 0.45mm/px · 1 of 30 slices shown]
[im 1/30]
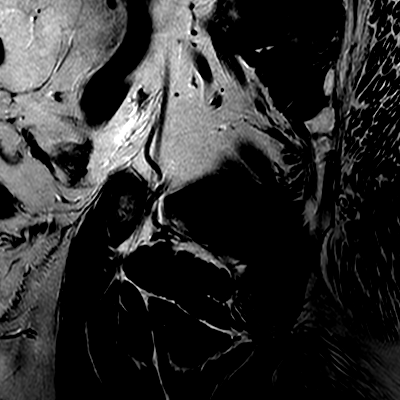

[Series 501: t2w cor · coronal · 3.0mm · 0.42mm/px · 1 of 30 slices shown]
[im 1/30]
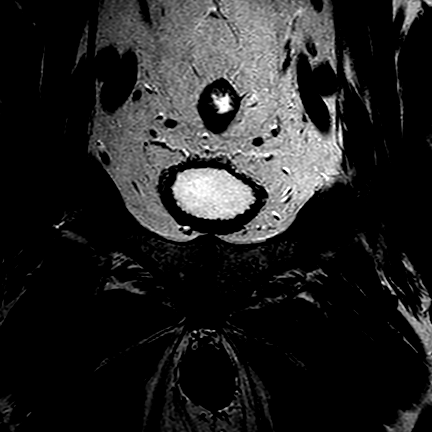

[Series 601: t2w ax · axial · 3.0mm · 0.27mm/px · 1 of 32 slices shown]
[im 1/32]
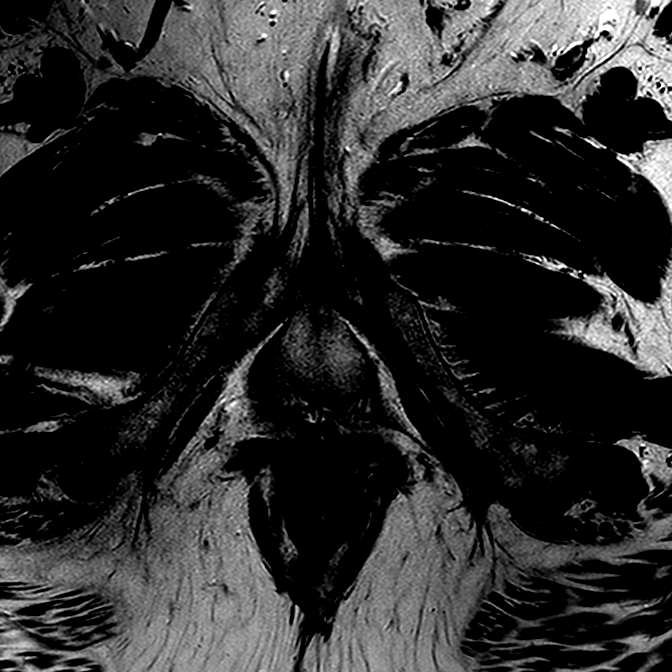

[Series 701: new-dwi_3b* 3mm* · axial · 3.0mm · 1.28mm/px · 1 of 64 slices shown]
[im 1/64]
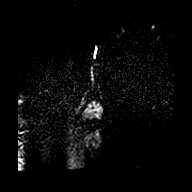

[Series 702: ADC · axial · 3.0mm · 1.28mm/px · 1 of 32 slices shown (1 of 2)]
[im 1/32]
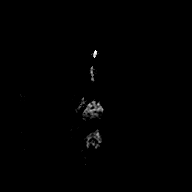

[Series 703: ADC · axial · 3.0mm · 1.28mm/px · 1 of 32 slices shown (2 of 2)]
[im 1/32]
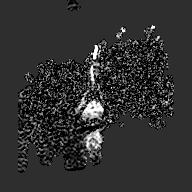

[Series 704: (id) · axial · 3.0mm · 1.28mm/px · 1 of 32 slices shown (1 of 3)]
[im 1/32]
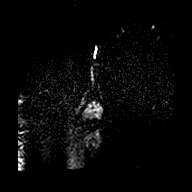

[Series 705: (id) · axial · 3.0mm · 1.28mm/px · 1 of 32 slices shown (2 of 3)]
[im 1/32]
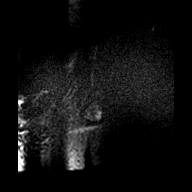

[Series 801: (id) · axial · 3.0mm · 1.28mm/px · 1 of 32 slices shown (3 of 3)]
[im 1/32]
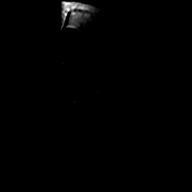

[Series 1001: dyn 3mm*(ap) · axial · 3.0mm · 1.38mm/px · z∈[-8,+85]mm · 36 of 1440 slices shown]
[im 1/1440]
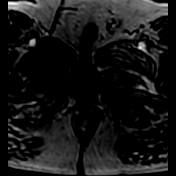
[im 42/1440]
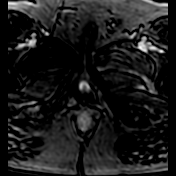
[im 83/1440]
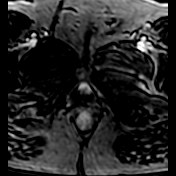
[im 124/1440]
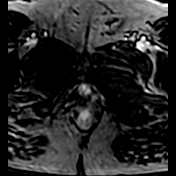
[im 165/1440]
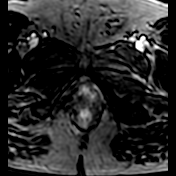
[im 206/1440]
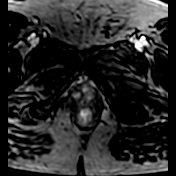
[im 247/1440]
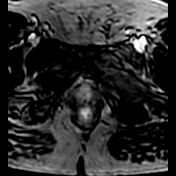
[im 288/1440]
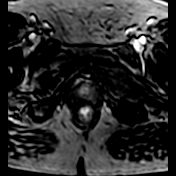
[im 329/1440]
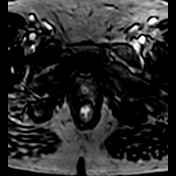
[im 371/1440]
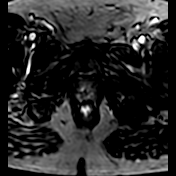
[im 412/1440]
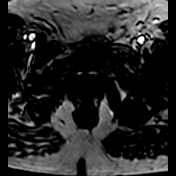
[im 453/1440]
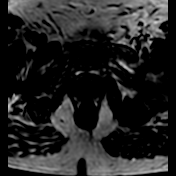
[im 494/1440]
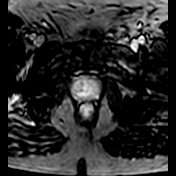
[im 535/1440]
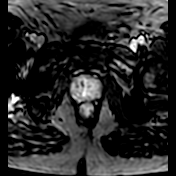
[im 576/1440]
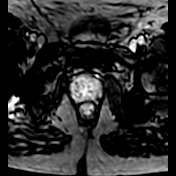
[im 617/1440]
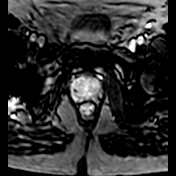
[im 658/1440]
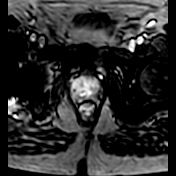
[im 699/1440]
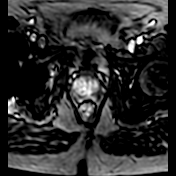
[im 741/1440]
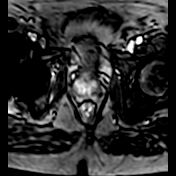
[im 782/1440]
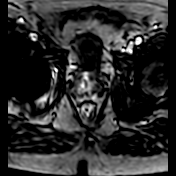
[im 823/1440]
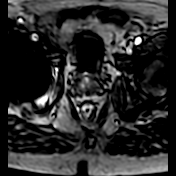
[im 864/1440]
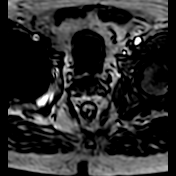
[im 905/1440]
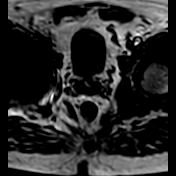
[im 946/1440]
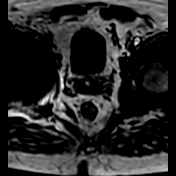
[im 987/1440]
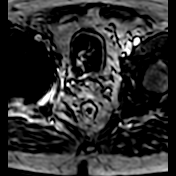
[im 1028/1440]
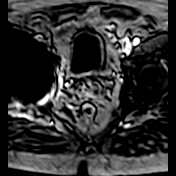
[im 1069/1440]
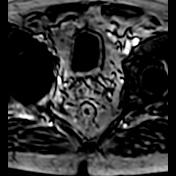
[im 1111/1440]
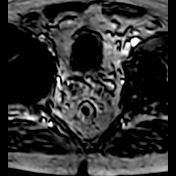
[im 1152/1440]
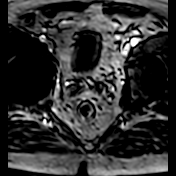
[im 1193/1440]
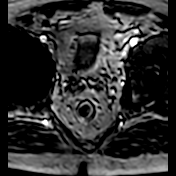
[im 1234/1440]
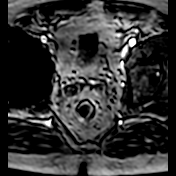
[im 1275/1440]
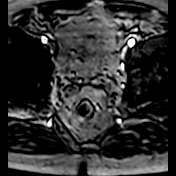
[im 1316/1440]
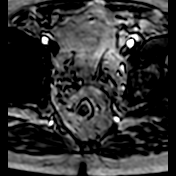
[im 1357/1440]
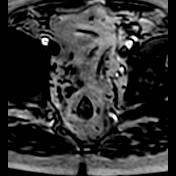
[im 1398/1440]
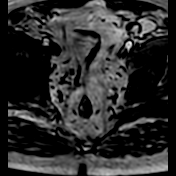
[im 1440/1440]
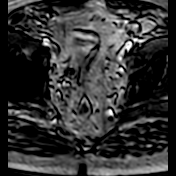

[Series 1101: (person_name)1/(person_name) · axial · 6.0mm · 0.68mm/px · 1 of 36 slices shown]
[im 1/36]
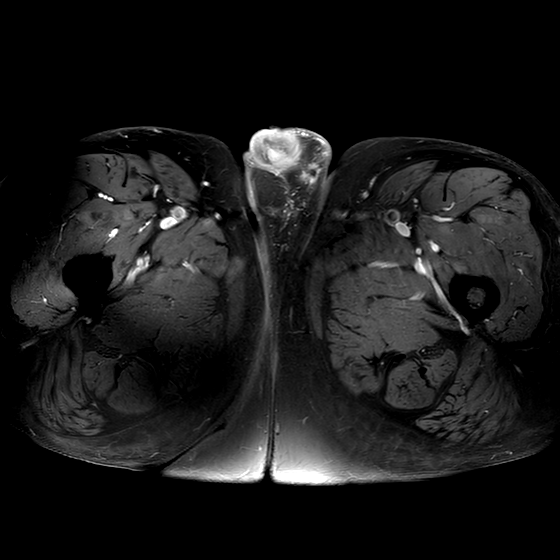

[48 of 48 positions shown; findings below may reference images not displayed]

FINDINGS: VOLUME: 32.8 cc 
CENTRAL GLAND: Nodular and mildly enlarged compatible with BPH. No suspicious 
central gland lesions. 
PERIPHERAL ZONE: The peripheral zone as relatively thin and shows heterogeneous 
T2 signal. There is a small area of decreased T2 signal within the right 
peripheral zone at the level of the apex at approximately 7 8 o'clock in 
location. This measures approximately 1.2 x 0.5 x 0.9 cm and appears to be more 
prominent compared to previous prostate MRI. This shows very subtle ADC and 
diffusion changes as well as increased vascularity on the perfusion images. 
While this may reflect an area of prostatitis underlying neoplasm would be 
difficult to exclude. The remainder the peripheral zone is unremarkable. 
PROSTATE CAPSULE: Intact 
MUSCLE SIDE WALLS: Normal 
SEMINAL VESICLES: Normal 
BLADDER: Normal 
LYMPHADENOPATHY: None 
BONES: No suspicious lesions. Previous right hip replacement.
IMPRESSION: 1. There is a small area of decreased T2 signal involving the right peripheral 
zone at the apex of the gland at approximately 7-8 o'clock position. This is 
more prominent compared to the prior examination and could potentially reflect 
focal prostatitis however underlying neoplasm would be difficult to exclude. 
This was targeted with the DynaCAD software. Consider biopsy if there is 
persistent elevation of the PSA level. 
2. BPH. No suspicious central gland lesions. 
3. Heterogeneous T2 signal of the peripheral zone likely related to chronic 
scarring. 
4. No adenopathy or bony lesions. 
(PI-RADS 4): High (clinically significant cancer is likely to be present).

## 2020-09-24 IMAGING — MR MULTI PARAMETRIC MRI PROSTATE W/O AND W
13 series · 48 of 48 positions shown · IV contrast (gadavist)
Comparison: Prior exam of 09/07/2019 and 04/12/2019.

________________________________________________________________________________________________ 
MULTI PARAMETRIC MRI PROSTATE W/O AND W, 09/24/2020 [DATE]: 
CLINICAL INDICATION: PSA increasing over 12. History of prostate carcinoma on 
prior MRI in August 2019. Active surveillance.
TECHNIQUE: Multiple parametric sequences were performed. Patient was scanned on 
a 3T magnet.  
Pre-contrast: T1 axial of the entire pelvis. T2 sagittal, axial and coronal, T1 
axial, acquired of the prostate. Diffusion with multiple B values of 6555, 4855 
calculated ADC value for mapping. 
Post contrast: Rapid sequence dynamic and axial planes through the prostate and 
seminal vesicles, T1 axial with fat sat of the entire pelvis. 3-D renderings 
were reconstructed on an independent workstation. The images were also evaluated 
with Dyna CAD computer aided detection. 13 of Gadavist were injected 
intravenously with the injector at a rate of 1.5 mL per second. As per [HOSPITAL] guidelines 3-D reconstructions are performed with 
concurrent physician supervision.

[Series 101: survey-flex coils · axial · 10.0mm · 1.34mm/px · 1 of 14 slices shown]
[im 1/14]
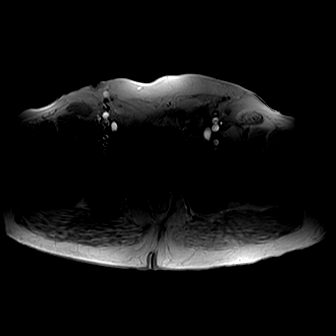

[Series 201: T1 · axial · 6.0mm · 0.57mm/px · 1 of 36 slices shown]
[im 1/36]
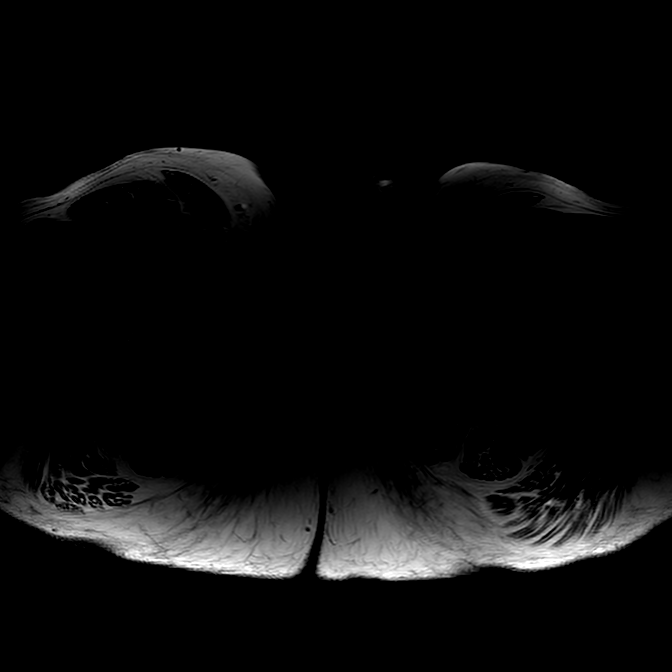

[Series 301: t2w sag · sagittal · 3.0mm · 0.45mm/px · 1 of 30 slices shown]
[im 1/30]
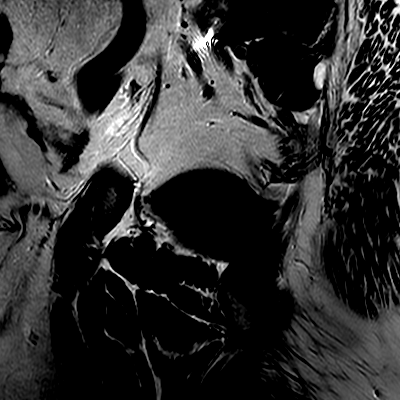

[Series 401: t2w cor · coronal · 3.0mm · 0.42mm/px · 1 of 30 slices shown]
[im 1/30]
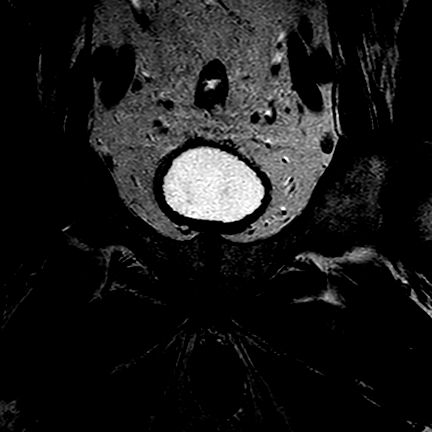

[Series 501: t2w ax · axial · 3.0mm · 0.27mm/px · 1 of 32 slices shown]
[im 1/32]
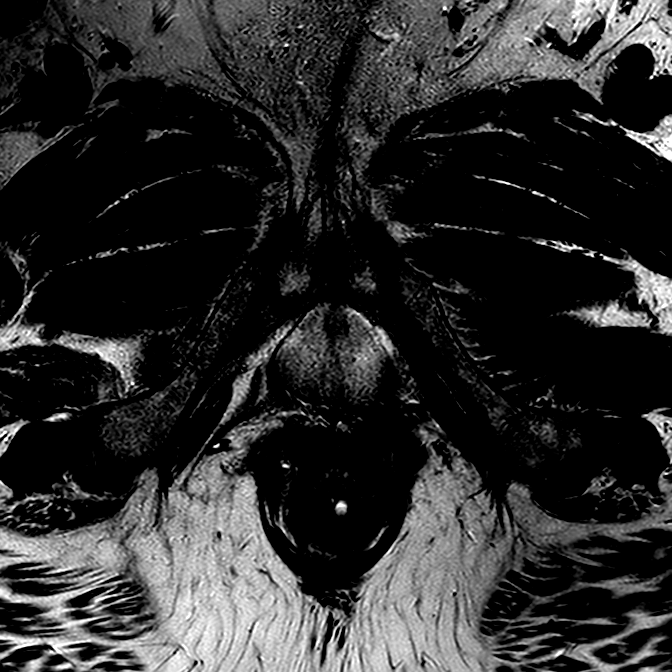

[Series 601: new-dwi_3b* 3mm* · axial · 3.0mm · 1.28mm/px · 1 of 60 slices shown]
[im 1/60]
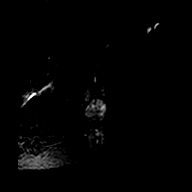

[Series 602: ADC · axial · 3.0mm · 1.28mm/px · 1 of 32 slices shown (1 of 2)]
[im 1/32]
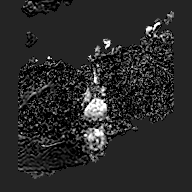

[Series 603: ADC · axial · 3.0mm · 1.28mm/px · 1 of 32 slices shown (2 of 2)]
[im 1/32]
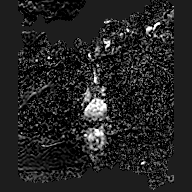

[Series 604: (id) · axial · 3.0mm · 1.28mm/px · 1 of 31 slices shown (1 of 3)]
[im 1/31]
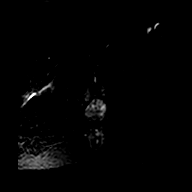

[Series 605: (id) · axial · 3.0mm · 1.28mm/px · 1 of 28 slices shown (2 of 3)]
[im 1/28]
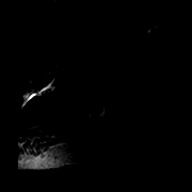

[Series 701: (id) · axial · 3.0mm · 1.28mm/px · 1 of 32 slices shown (3 of 3)]
[im 1/32]
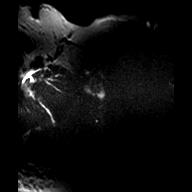

[Series 901: dyn 3mm*(ap) · axial · 3.0mm · 1.38mm/px · z∈[-48,+45]mm · 36 of 1440 slices shown]
[im 1/1440]
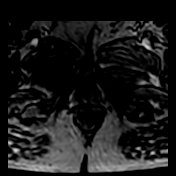
[im 42/1440]
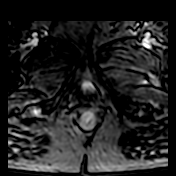
[im 83/1440]
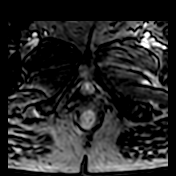
[im 124/1440]
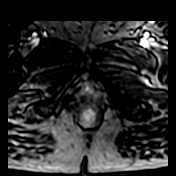
[im 165/1440]
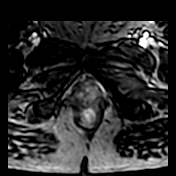
[im 206/1440]
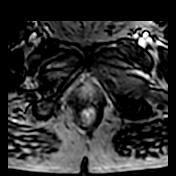
[im 247/1440]
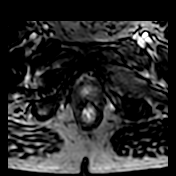
[im 288/1440]
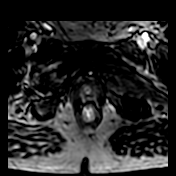
[im 329/1440]
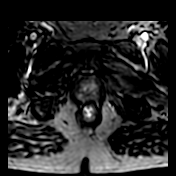
[im 371/1440]
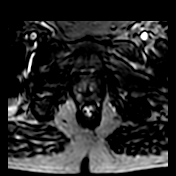
[im 412/1440]
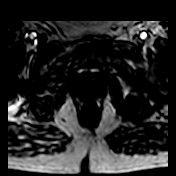
[im 453/1440]
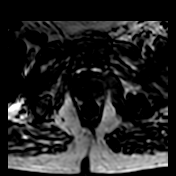
[im 494/1440]
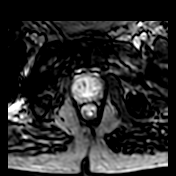
[im 535/1440]
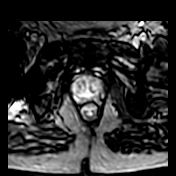
[im 576/1440]
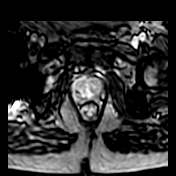
[im 617/1440]
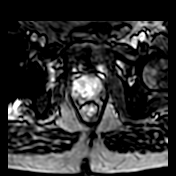
[im 658/1440]
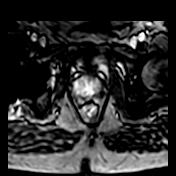
[im 699/1440]
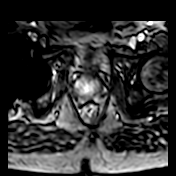
[im 741/1440]
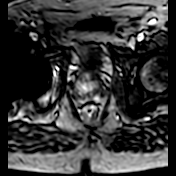
[im 782/1440]
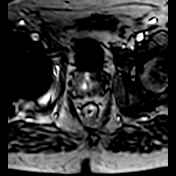
[im 823/1440]
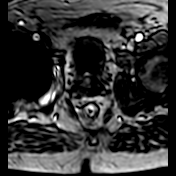
[im 864/1440]
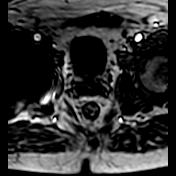
[im 905/1440]
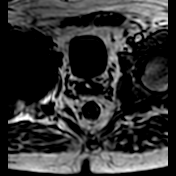
[im 946/1440]
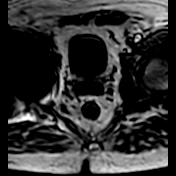
[im 987/1440]
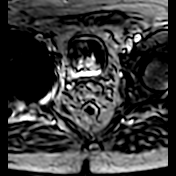
[im 1028/1440]
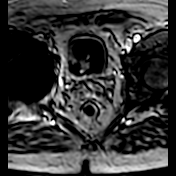
[im 1069/1440]
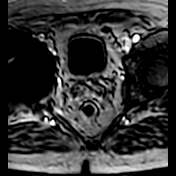
[im 1111/1440]
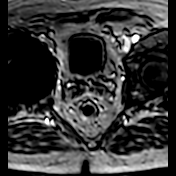
[im 1152/1440]
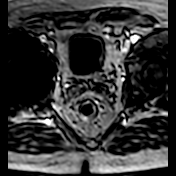
[im 1193/1440]
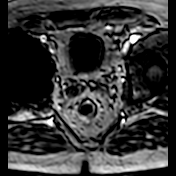
[im 1234/1440]
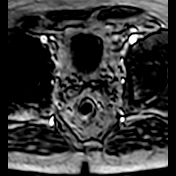
[im 1275/1440]
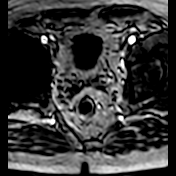
[im 1316/1440]
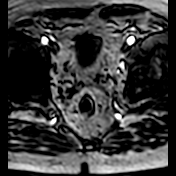
[im 1357/1440]
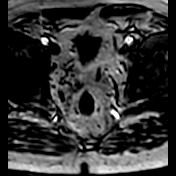
[im 1398/1440]
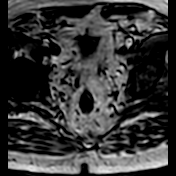
[im 1440/1440]
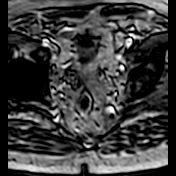

[Series 1001: (person_name)1/(person_name) · axial · 6.0mm · 0.50mm/px · 1 of 36 slices shown]
[im 1/36]
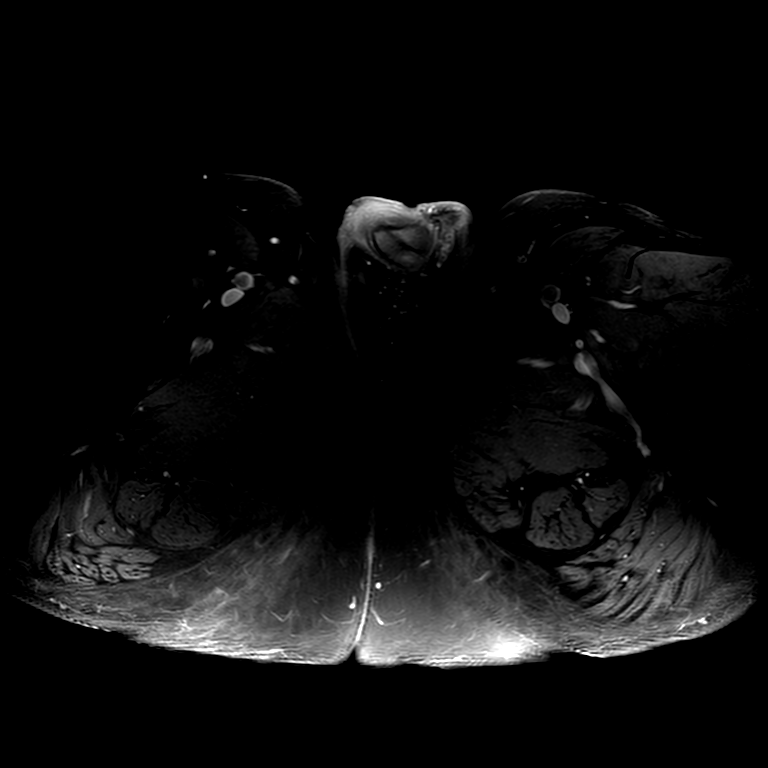

[48 of 48 positions shown; findings below may reference images not displayed]

FINDINGS: VOLUME: 5.3 x 3.9 x 4.1 cm. Prostate volume is estimated at 42 cc. 
CENTRAL GLAND: There is heterogeneous T2 signal intensity again seen. No focal 
dominant decreased T2 nodule. 
PERIPHERAL ZONE: Diffuse thinning of the peripheral zones are again seen. There 
is persistent patchy decreased T2 signal intensity within both peripheral zones 
including within the right peripheral zone, juncture between the anterior and 
posterior peripheral zone, at the mid to apex level as was noted on the prior MR 
exam. This is seen for example on the axial T2 series, 501, image 9. There is 
questionable subtle corollary increased signal on the B 4855 series, image 9. It 
is unclear if this is real or artifactual given artifact from the right THA. 
PROSTATE CAPSULE: Preserved 
MUSCLE SIDE WALLS: Preserved 
SEMINAL VESICLES: Preserved 
BLADDER: Preserved 
LYMPHADENOPATHY: None 
BONES: Metallic susceptibility artifact from right THA. No focal marrow 
replacing lesion or suggestion of osseous metastatic disease.
IMPRESSION: Indeterminate findings right peripheral zone but unchanged T2 appearance. Given 
the artifact from the adjacent THA, this is favored to be a PI-RADS 3 Lesion. 
(PI-RADS 3): Intermediate ( the presence of clinically significant cancer is 
equivocal).

## 2021-05-08 IMAGING — DX CHEST PA AND LATERAL
1 series · 5 of 5 positions shown · non-contrast
Comparison: None.

________________________________________________________________________________________________ 
CLINICAL INDICATION: DX: CXR PATIENT ASPIRATED GOLD CROWN

[Series 1: PA · U · 0.14mm/px · 5 of 5 slices shown]
[im 1/5]
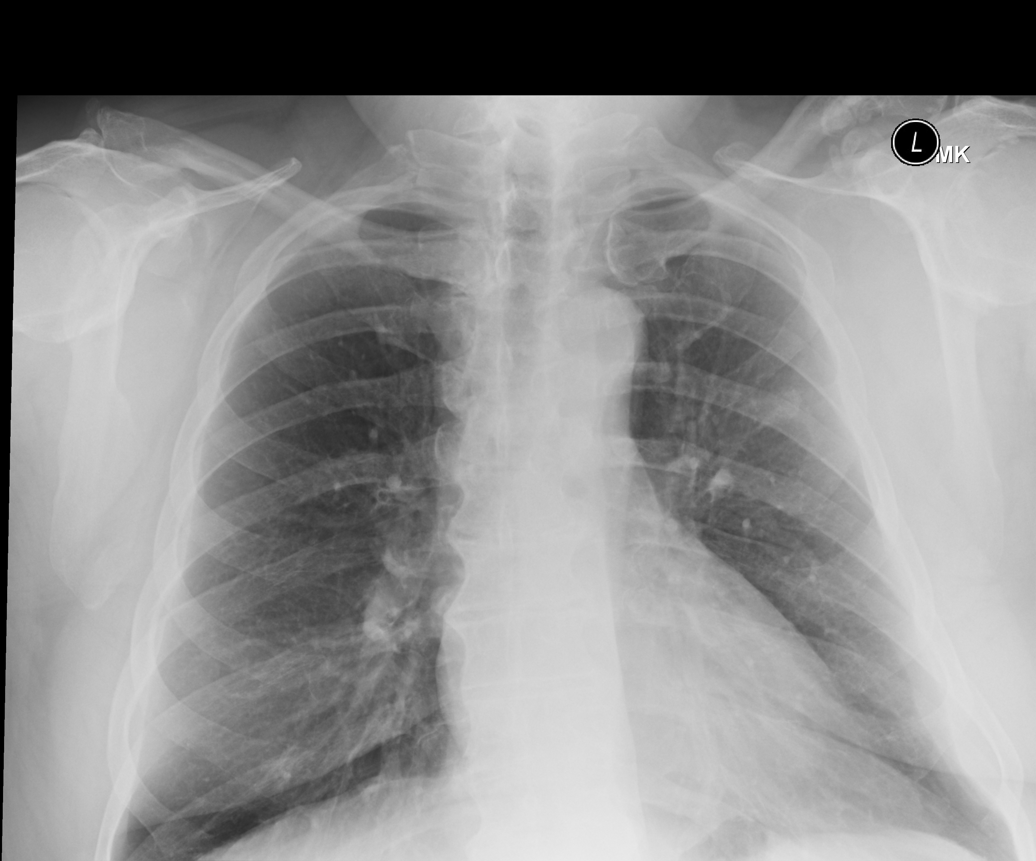
[im 2/5]
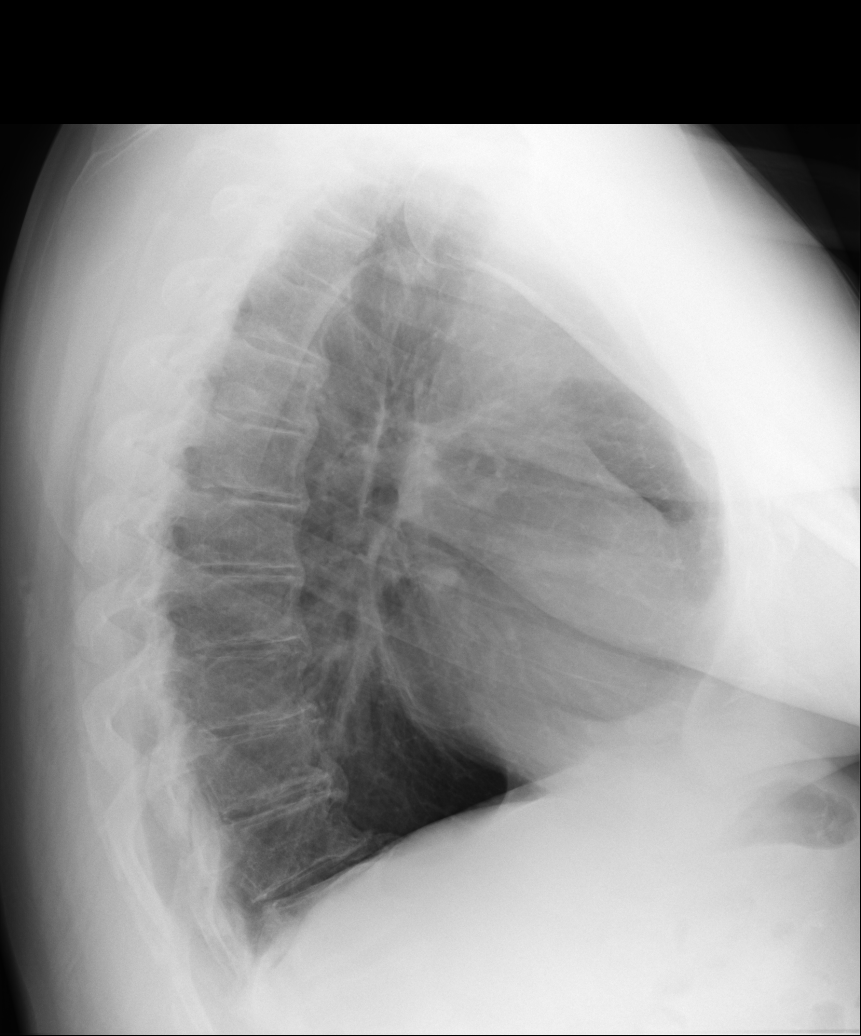
[im 3/5]
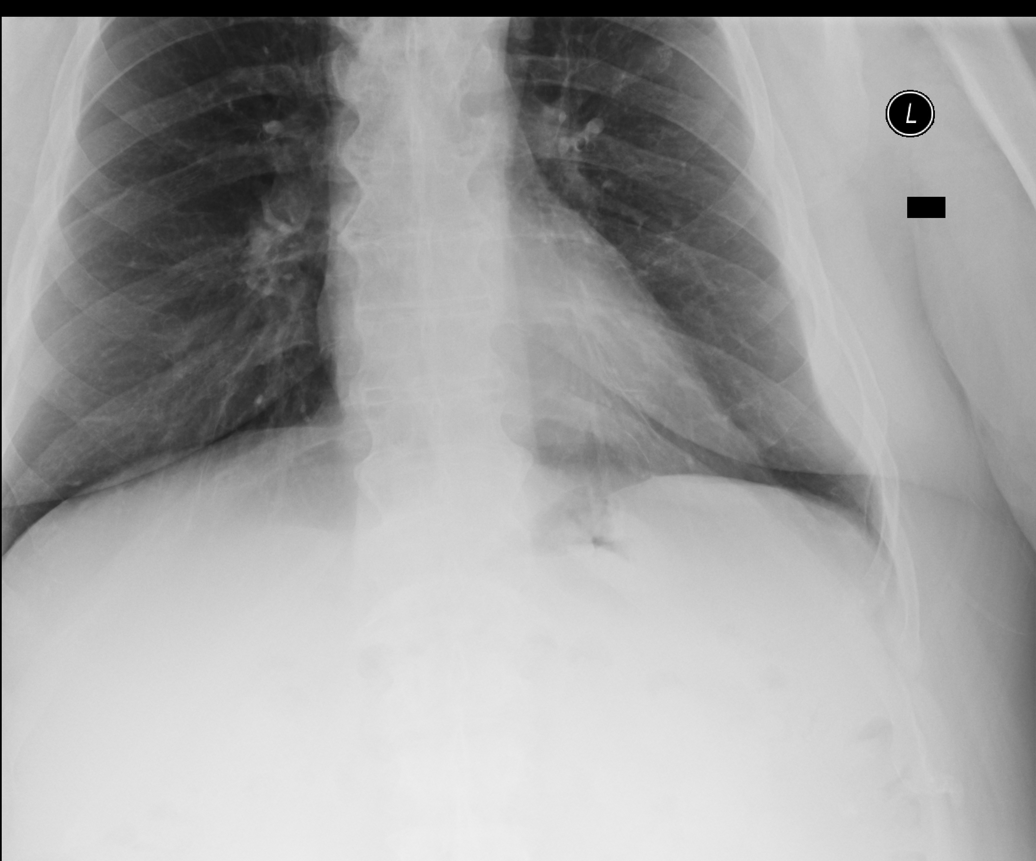
[im 4/5]
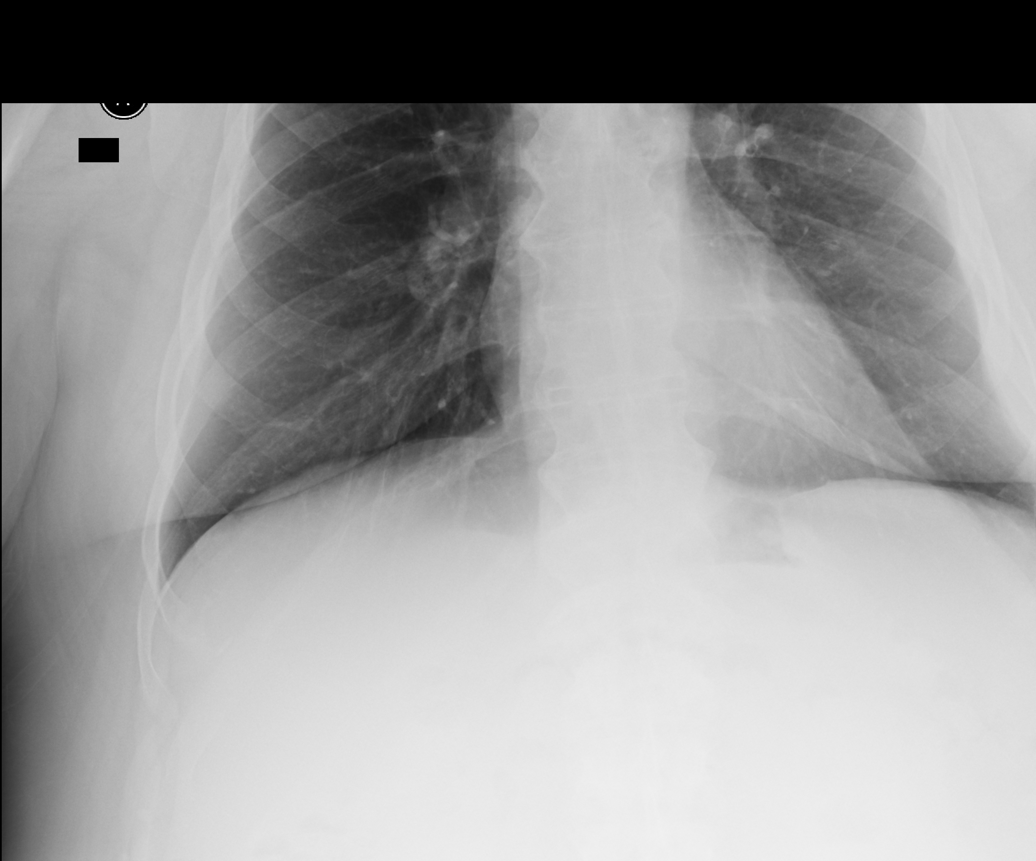
[im 5/5]
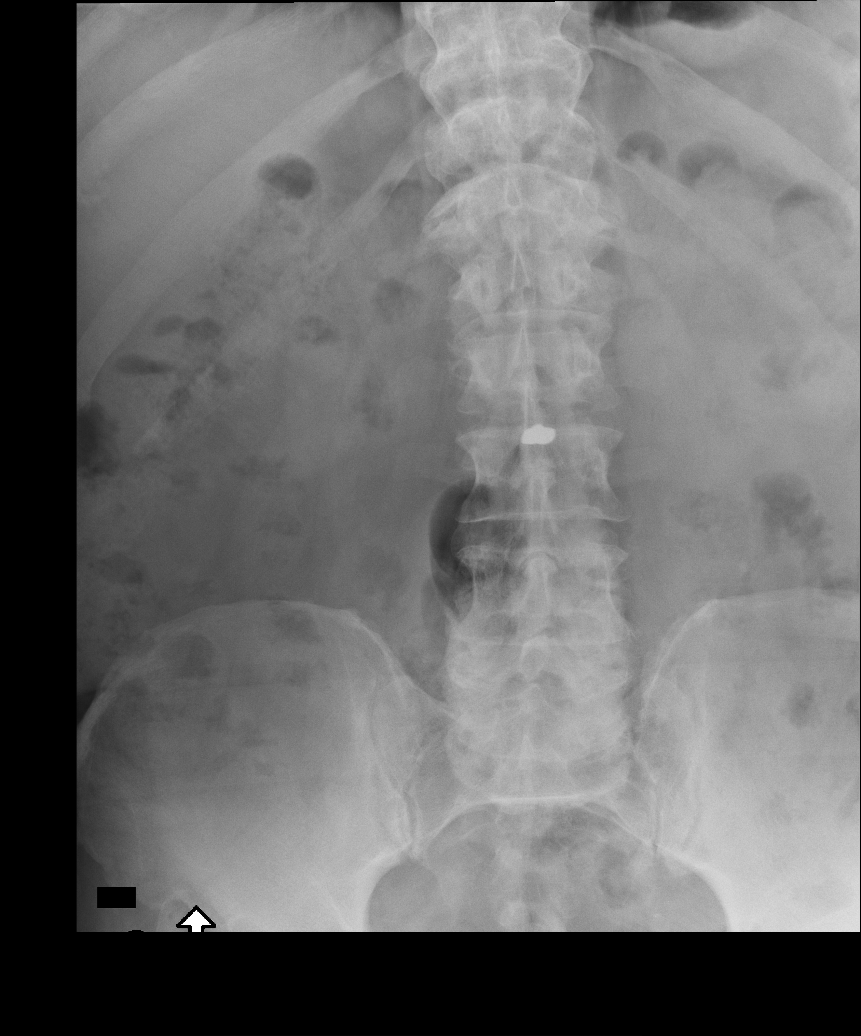

[5 of 5 positions shown; findings below may reference images not displayed]

FINDINGS: Lungs are clear. No consolidation. No effusion. Normal cardiac size 
and pulmonary vascularity. No pneumothorax. No acute osseous abnormality. Mild 
degenerative change of the thoracic spine.
IMPRESSION: No acute cardiopulmonary findings.

## 2021-09-23 IMAGING — NM BONE SCAN WHOLE BODY
1 series · 2 of 2 positions shown · non-contrast
Comparison: 09/24/2020 prostate MRI

________________________________________________________________________________________________ 
BONE SCAN WHOLE BODY, 09/23/2021 [DATE]: 
CLINICAL INDICATION: Malignant neoplasm prostate. Left low back pain.
TECHNIQUE: Following the injection of  24.9 mCi MIEZ Tc 99m MDP scanning of the 
entire skeleton was performed.

[Series 1000: wholebody · 2.40mm/px · 2 of 2 frames shown]
[frame 1/2]
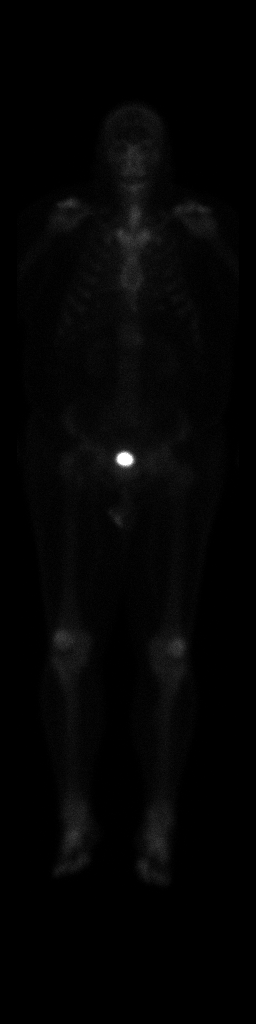
[frame 2/2]
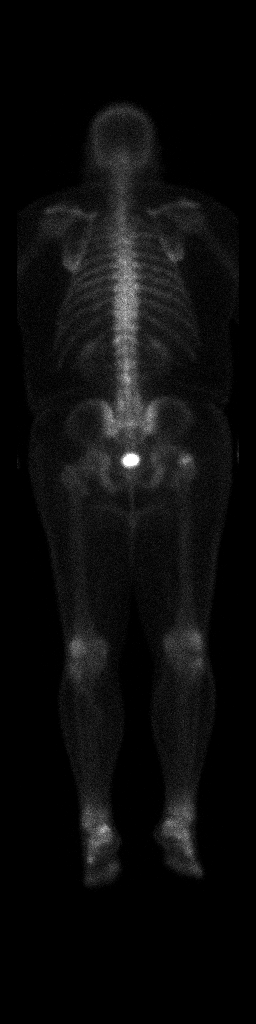

[2 of 2 positions shown; findings below may reference images not displayed]

FINDINGS: Physiologic uptake of radiotracer. Mildly increased radiotracer uptake 
in the left anterior upper lumbar spine. Photopenic defect in the right hip 
corresponds to right hip arthroplasty (THA). Focal increased radiotracer uptake 
about the posterior right femoral greater trochanter: Prior MRI demonstrated 
heterotopic ossification in this region. Mild degenerative uptake in the 
shoulders, left hip and feet.
IMPRESSION: 1.  Mildly increased radiotracer uptake in the left anterior upper lumbar spine: 
Although findings are likely degenerative, MRI lumbar spine without contrast may 
be helpful for further evaluation.  
2.  Degenerative change, right THA and uptake about the right femoral greater 
trochanter, likely due to heterotopic ossification.

## 2021-10-23 IMAGING — MR MRI LUMBAR SPINE W/WO CONTRAST
5 of 12 series · 10 of 48 positions shown · IV contrast (Gadolinium)
Comparison: None.

________________________________________________________________________________________________ 
MRI LUMBAR SPINE W/WO CONTRAST, 10/23/2021 [DATE]: 
CLINICAL INDICATION: Low back pain with radiation around left side to lower 
abdomen. Prostate cancer.
TECHNIQUE: Multiplanar, multiecho position MR images of the lumbar spine were 
performed without and with 13.5 mL of Gadavist were injected intravenously by 
hand. 1.5 mL of Gadavist were discarded. Patient was scanned on a 1.5T magnet.

[Series 101: survey · axial · 10.0mm · 1.25mm/px · z∈[-33,+201]mm · 2 of 10 slices shown]
[im 1/10]
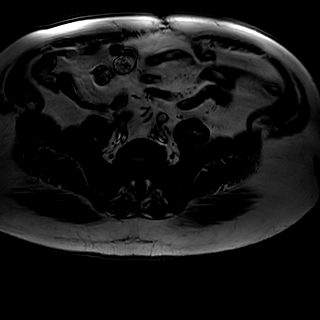
[im 10/10]
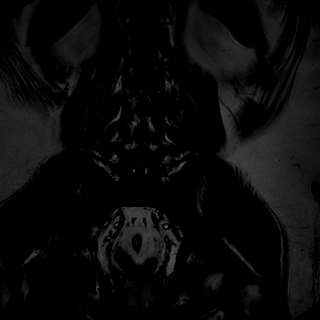

[Series 201: t2w_cor-surv · coronal · 6.0mm · 0.62mm/px · 1 of 14 slices shown]
[im 1/14]
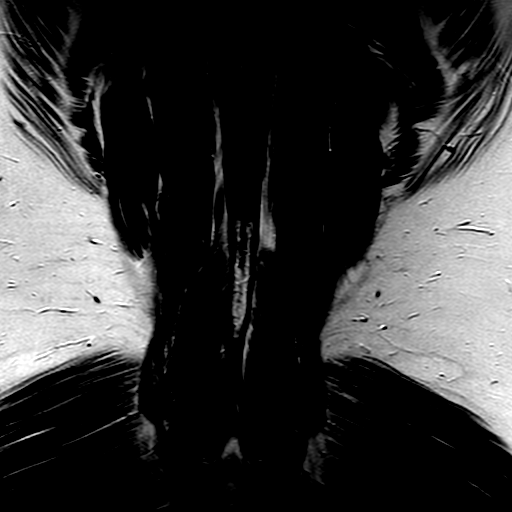

[Series 301: t1_tse_sag · sagittal · 4.0mm · 0.48mm/px · 2 of 19 slices shown]
[im 1/19]
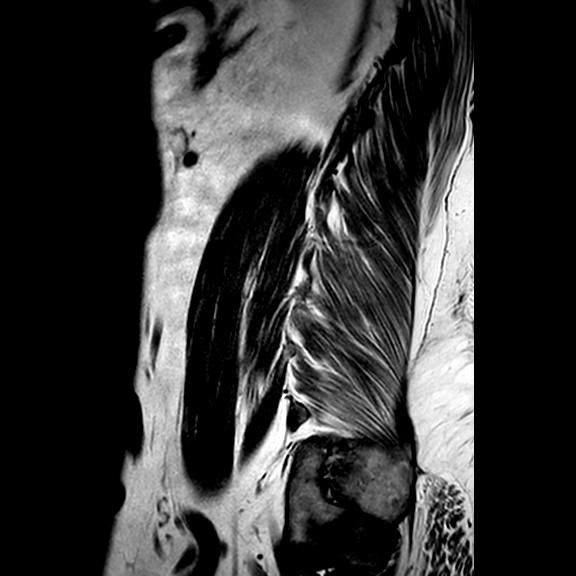
[im 19/19]
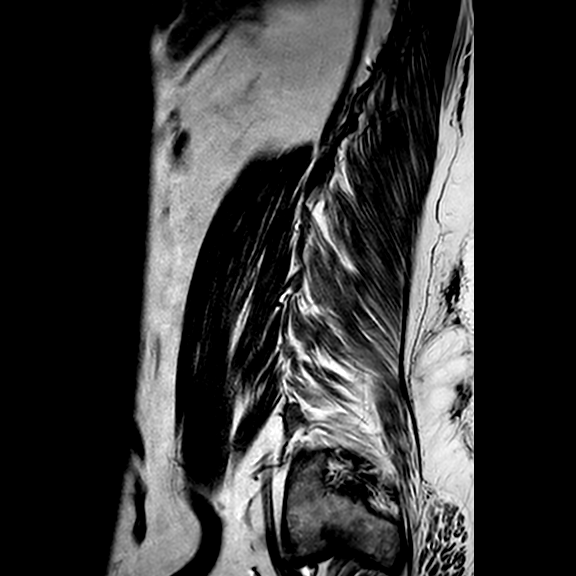

[Series 402: (id) view_ax mpr · axial · 1.0mm · 0.25mm/px · z∈[-45,-4]mm · 2 of 140 slices shown]
[im 1/140]
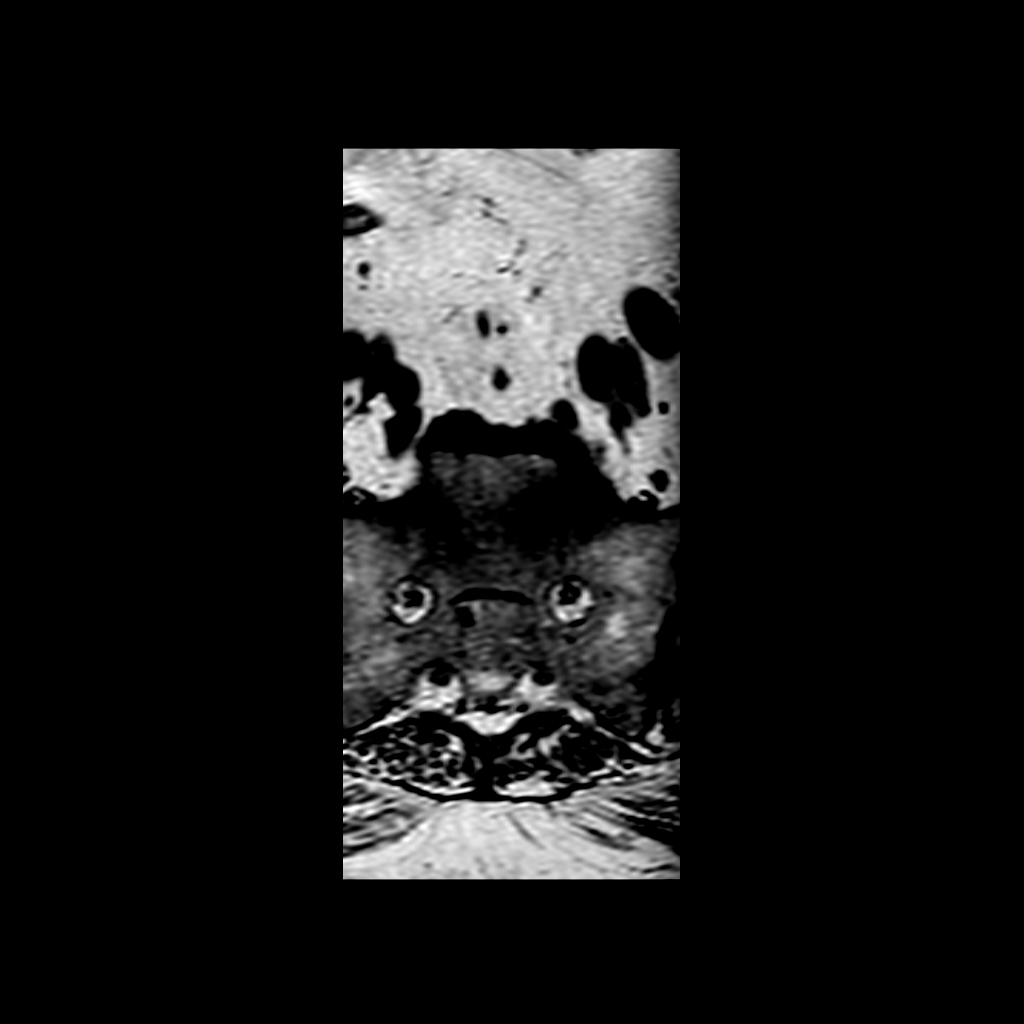
[im 22/140]
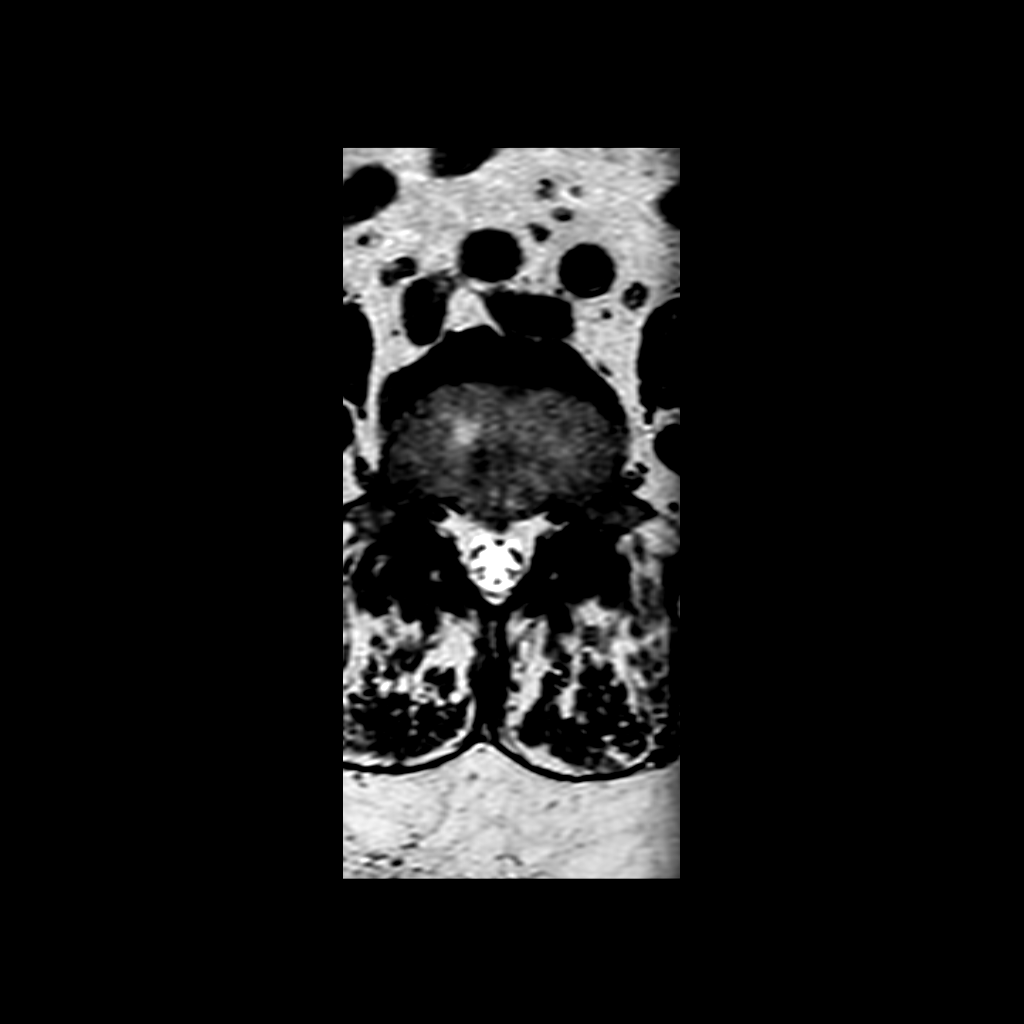

[Series 901: T1 post-contrast · oblique · 4.0mm · 0.30mm/px · 3 of 30 slices shown]
[im 1/30]
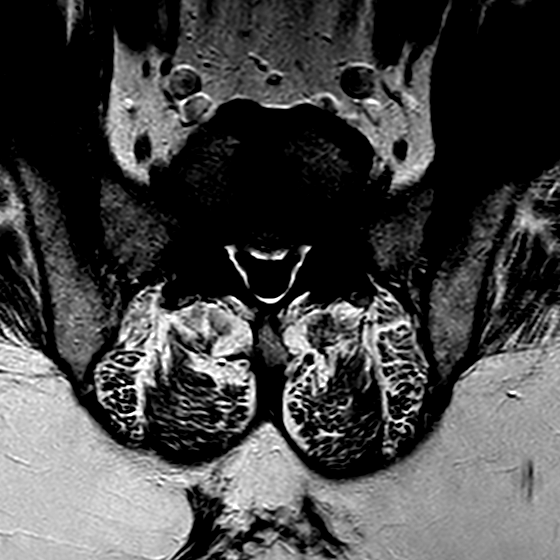
[im 15/30]
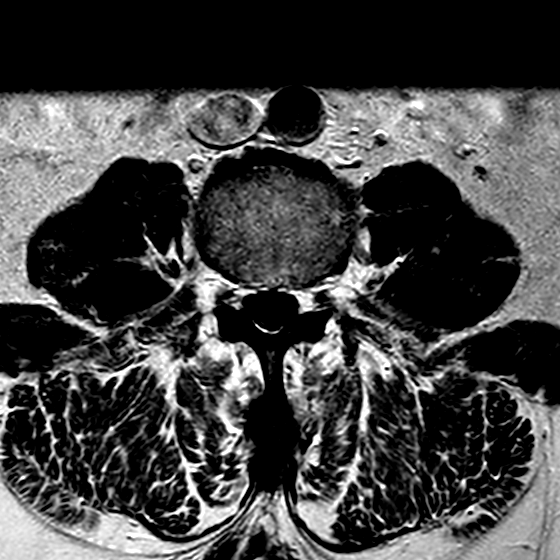
[im 30/30]
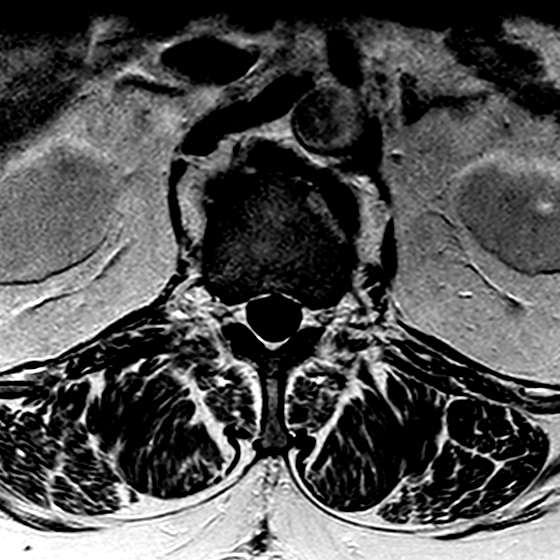

[10 of 48 positions shown; findings below may reference images not displayed]

FINDINGS: -------------------------------------------------------------------------------- 
------ 
GENERAL: 
Nomenclature is based on 5 lumbar type vertebral bodies.     
ALIGNMENT: Mild retrolisthesis of T12 on L1, L1 on L2. 
VERTEBRAL BODY HEIGHT: Normal.  
MARROW SIGNAL: There is T1 hypointense, enhancing, T2 hyperintense signal along 
the left anterior inferior margin of T12, favor that this is degenerative in 
nature as this is located adjacent to an anterior endplate osteophyte. 
CORD SIGNAL: Normal distal spinal cord and cauda equina.  Conus terminates at L2 
superior endplate level. 
ADDITIONAL FINDINGS: None. 
Modic I-II: None. 
Ligamentum Flavum > 2.5 mm: All levels. 
-------------------------------------------------------------------------------- 
------ 
SEGMENTAL: 
T12-L1: Disc bulge with endplate osteophyte production. Bilateral facet and 
ligamentum flavum hypertrophy. Mild to moderate central canal narrowing.  No 
significant right neural foraminal narrowing. No significant left neural 
foraminal narrowing.  
L1-L2: Trace disc bulge with bilateral facet hypertrophy. Synovial cyst 
extending medially from the right facet joint. Mild right-sided central canal 
narrowing.  No significant right neural foraminal narrowing. No significant left 
neural foraminal narrowing.  
L2-L3: Trace disc bulge. Mild bilateral facet hypertrophy. No significant 
central canal narrowing.  No significant right neural foraminal narrowing. No 
significant left neural foraminal narrowing.  
L3-L4: Trace disc bulge. Bilateral facet hypertrophy. No significant central 
canal narrowing.  No significant right neural foraminal narrowing. No 
significant left neural foraminal narrowing.  
L4-L5: Uncovering of the disc space. Extensive bilateral facet ligamentum flavum 
hypertrophy. Moderate central canal narrowing.  Moderate right neural foraminal 
narrowing. Moderate left neural foraminal narrowing.  
L5-S1: Bilateral facet hypertrophy. No significant central canal narrowing.  
Moderate right neural foraminal narrowing. Moderate left neural foraminal 
narrowing.  
-------------------------------------------------------------------------------- 
------
IMPRESSION: 1.  Likely active degenerative change along the left anterior inferior margin of 
T12. 
2.  Discogenic/degenerative changes as above. 
3.  Worst level(s) of central canal/subarticular recess narrowing: L4-L5 
4.  Worst level(s) of neural foraminal narrowing: L4-L5, L5-S1

## 2021-11-12 IMAGING — MR MULTI PARAMETRIC MRI PROSTATE W/O AND W
14 series · 48 of 48 positions shown · IV contrast (gadavist)
Comparison: September 2020 exam

________________________________________________________________________________________________ 
MULTI PARAMETRIC MRI PROSTATE W/O AND W, 11/12/2021 [DATE]: 
CLINICAL INDICATION: History of prostate carcinoma. Monitoring. Increasing PSA.
TECHNIQUE: Multiple parametric sequences were performed. Patient was scanned on 
a 3T magnet. Pre-contrast: T1 axial of the entire pelvis. T2 sagittal, axial and 
coronal, T1 axial, acquired of the prostate. Diffusion with multiple B values of 
6666, 9999 calculated ADC value for mapping. Post contrast: Rapid sequence 
dynamic and axial planes through the prostate and seminal vesicles, T1 axial 
with fat sat of the entire pelvis. 3-D renderings were reconstructed on an 
independent workstation. The images were also evaluated with Dyna CAD computer 
aided detection. 14 of Gadavist were injected intravenously. 1 mL of Gadavist 
were discarded. As per [HOSPITAL] guidelines 3-D 
reconstructions are performed with concurrent physician supervision.

[Series 101: survey-mst · axial · 10.0mm · 1.34mm/px · 1 of 14 slices shown]
[im 1/14]
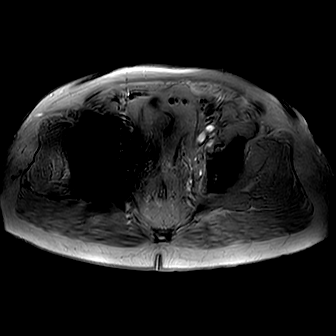

[Series 201: T1 · axial · 6.0mm · 0.64mm/px · 1 of 36 slices shown]
[im 1/36]
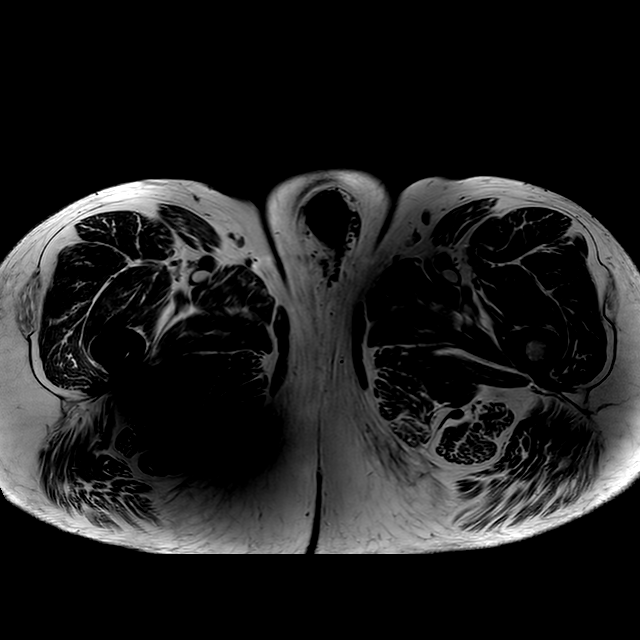

[Series 301: t2w sag · sagittal · 3.0mm · 0.45mm/px · 1 of 30 slices shown]
[im 1/30]
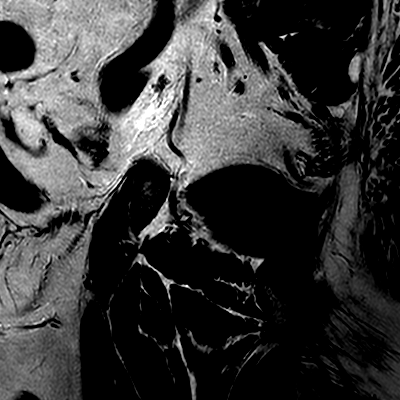

[Series 401: t2w cor · coronal · 3.0mm · 0.42mm/px · 1 of 30 slices shown]
[im 1/30]
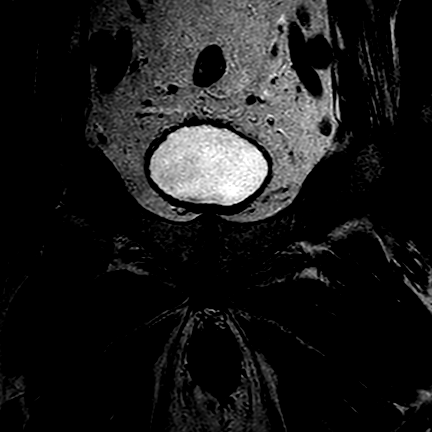

[Series 501: t2w ax · axial · 3.0mm · 0.27mm/px · 1 of 32 slices shown]
[im 1/32]
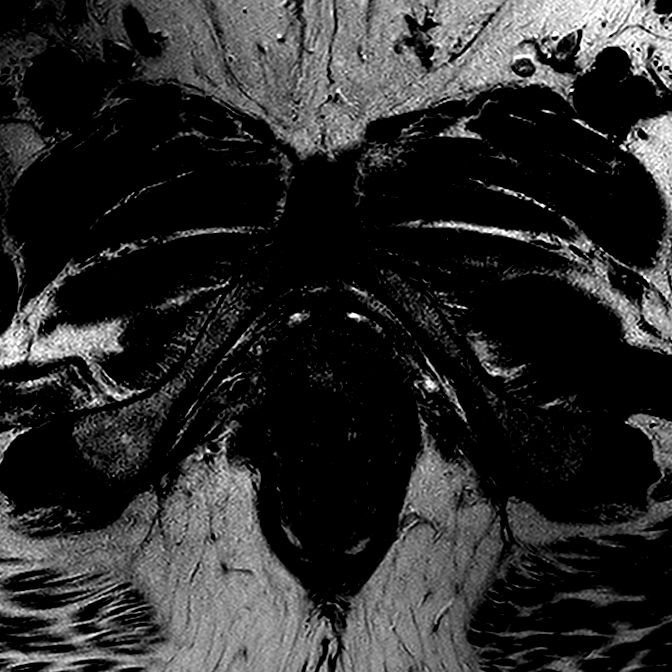

[Series 601: DWI · axial · 3.0mm · 0.96mm/px · 1 of 64 slices shown]
[im 1/64]
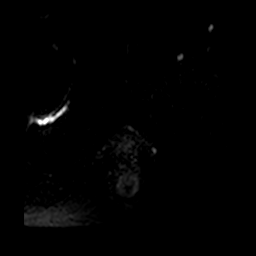

[Series 602: ADC · axial · 3.0mm · 0.96mm/px · 1 of 32 slices shown (1 of 2)]
[im 1/32]
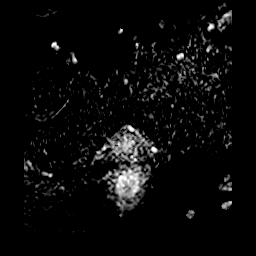

[Series 603: ADC · axial · 3.0mm · 0.96mm/px · 1 of 32 slices shown (2 of 2)]
[im 1/32]
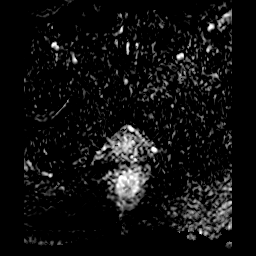

[Series 604: (id) · axial · 3.0mm · 0.96mm/px · 1 of 32 slices shown (1 of 2)]
[im 1/32]
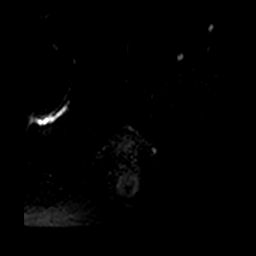

[Series 605: (id) · axial · 3.0mm · 0.96mm/px · 1 of 32 slices shown (2 of 2)]
[im 1/32]
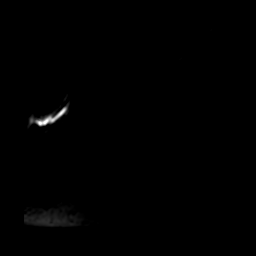

[Series 701: (id) ax · axial · 3.0mm · 1.28mm/px · 1 of 32 slices shown]
[im 1/32]
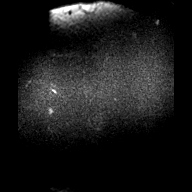

[Series 901: dyn 3mm*(ap) · axial · 3.0mm · 1.38mm/px · z∈[-95,-2]mm · 35 of 1440 slices shown]
[im 1/1440]
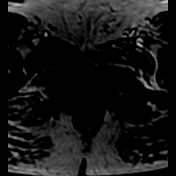
[im 43/1440]
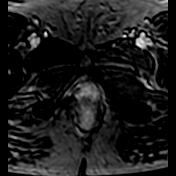
[im 85/1440]
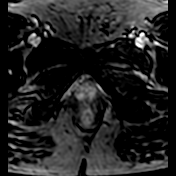
[im 127/1440]
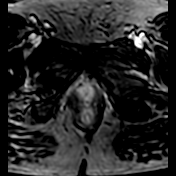
[im 170/1440]
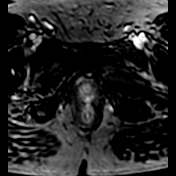
[im 212/1440]
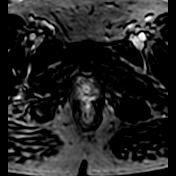
[im 254/1440]
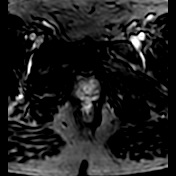
[im 297/1440]
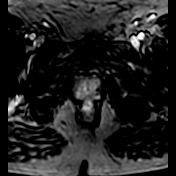
[im 339/1440]
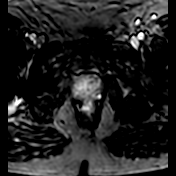
[im 381/1440]
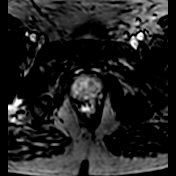
[im 424/1440]
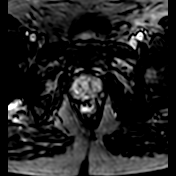
[im 466/1440]
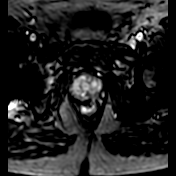
[im 508/1440]
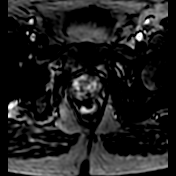
[im 551/1440]
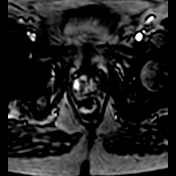
[im 593/1440]
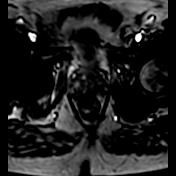
[im 635/1440]
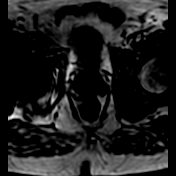
[im 678/1440]
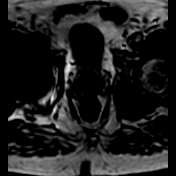
[im 720/1440]
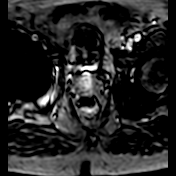
[im 762/1440]
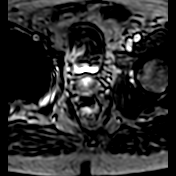
[im 805/1440]
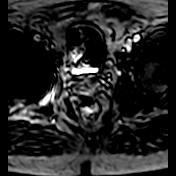
[im 847/1440]
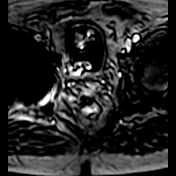
[im 889/1440]
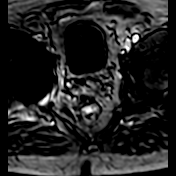
[im 932/1440]
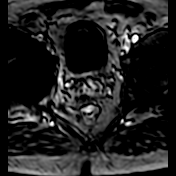
[im 974/1440]
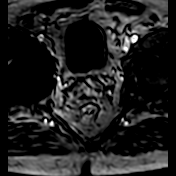
[im 1016/1440]
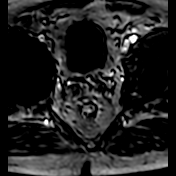
[im 1059/1440]
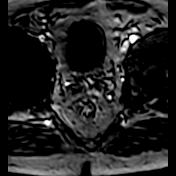
[im 1101/1440]
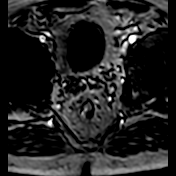
[im 1143/1440]
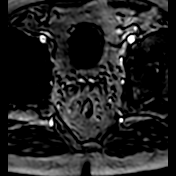
[im 1186/1440]
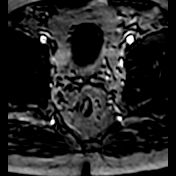
[im 1228/1440]
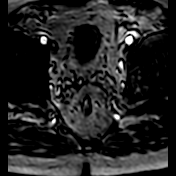
[im 1270/1440]
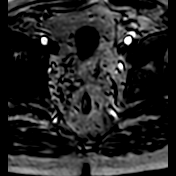
[im 1313/1440]
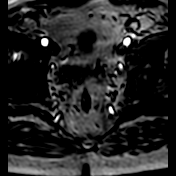
[im 1355/1440]
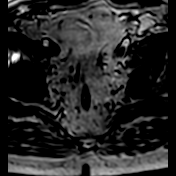
[im 1397/1440]
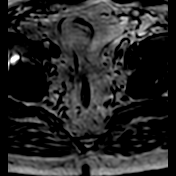
[im 1440/1440]
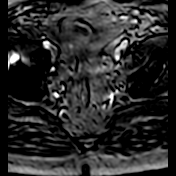

[Series 1001: DIXON · axial · 6.0mm · 0.47mm/px · 1 of 72 slices shown (1 of 2)]
[im 1/72]
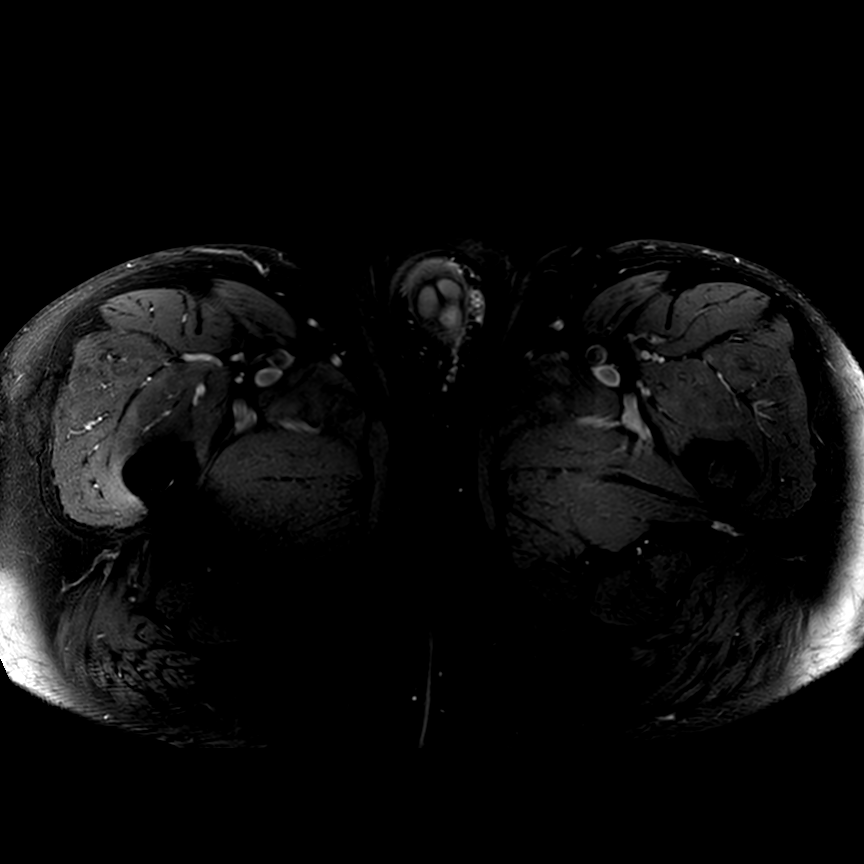

[Series 1002: DIXON · axial · 6.0mm · 0.47mm/px · 1 of 36 slices shown (2 of 2)]
[im 1/36]
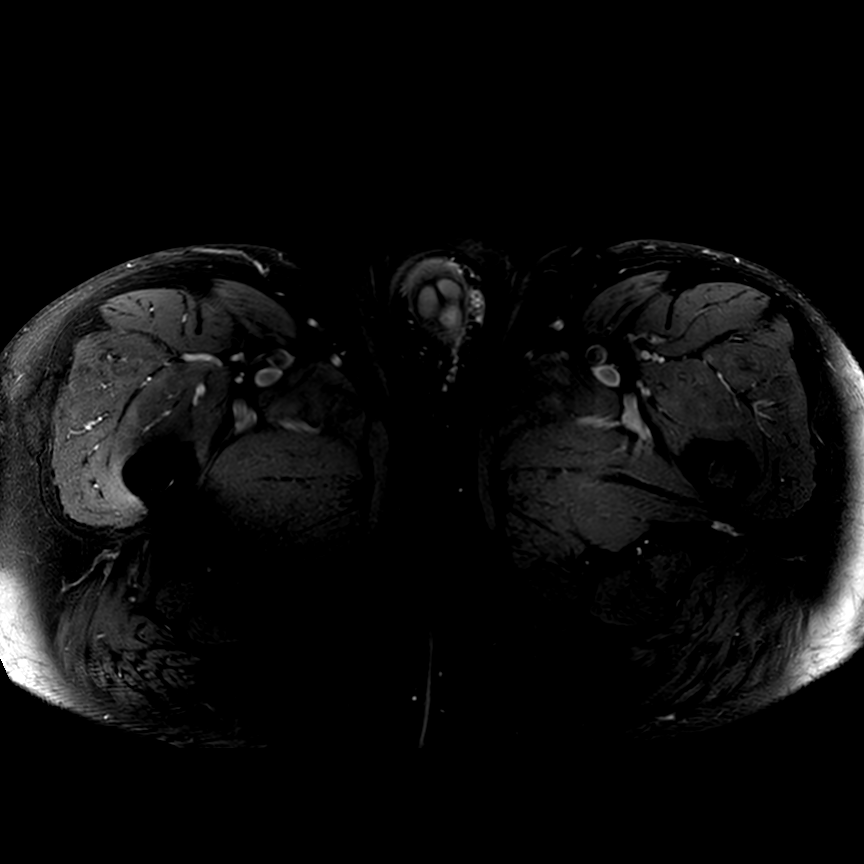

[48 of 48 positions shown; findings below may reference images not displayed]

FINDINGS: VOLUME: 40 cc 
CENTRAL GLAND: Nodular in appearance. No new suspicious abnormality seen. 
PERIPHERAL ZONE: Diffusion images limited by artifact from the right hip 
prosthesis. There is persistent decreased signal right posterior lateral 
peripheral zone on T2 axial image 7. This does show some asymmetric flow on the 
color images. Overall stable in appearance when compared to the exam from 
September 2020. No new abnormality seen. 
PROSTATE CAPSULE: Intact. 
MUSCLE SIDE WALLS: No evidence of malignancy. There is atrophy of the right 
obturator internal muscle compared to the left. 
SEMINAL VESICLES: Normal in appearance. 
BLADDER: Trabeculated. 
LYMPHADENOPATHY: None 
BONES: No osseous lesion seen. 
ADDITIONAL FINDINGS: Postop changes right hip. Degenerative changes.
IMPRESSION: Stable appearance with limited diffusion images secondary to the right hip 
prosthesis. Stable decreased T2 signal right posterior lateral peripheral zone 
mid gland level. Does show slight asymmetric flow. 
(PI-RADS 3): Intermediate ( the presence of clinically significant cancer is 
equivocal).
